# Patient Record
Sex: Male | Born: 1965 | Race: White | Hispanic: No | Marital: Single | Smoking: Current every day smoker
Health system: Southern US, Community
[De-identification: ages and names within clinical notes are randomized; demographics above are authoritative.]

## PROBLEM LIST (undated history)

## (undated) DIAGNOSIS — IMO0002 Reserved for concepts with insufficient information to code with codable children: Secondary | ICD-10-CM

## (undated) DIAGNOSIS — H5440 Blindness, one eye, unspecified eye: Secondary | ICD-10-CM

## (undated) DIAGNOSIS — IMO0001 Reserved for inherently not codable concepts without codable children: Secondary | ICD-10-CM

## (undated) DIAGNOSIS — Z6830 Body mass index (BMI) 30.0-30.9, adult: Secondary | ICD-10-CM

## (undated) DIAGNOSIS — M549 Dorsalgia, unspecified: Secondary | ICD-10-CM

## (undated) DIAGNOSIS — G8929 Other chronic pain: Secondary | ICD-10-CM

## (undated) HISTORY — DX: Body mass index (BMI) 30.0-30.9, adult: Z68.30

## (undated) HISTORY — PX: OTHER SURGICAL HISTORY: SHX169

---

## 1997-09-26 ENCOUNTER — Emergency Department (HOSPITAL_COMMUNITY): Admission: EM | Admit: 1997-09-26 | Discharge: 1997-09-26 | Payer: Self-pay | Admitting: Emergency Medicine

## 1998-02-09 ENCOUNTER — Emergency Department (HOSPITAL_COMMUNITY): Admission: EM | Admit: 1998-02-09 | Discharge: 1998-02-09 | Payer: Self-pay | Admitting: Emergency Medicine

## 1998-03-30 ENCOUNTER — Encounter: Payer: Self-pay | Admitting: Endocrinology

## 1998-03-30 ENCOUNTER — Emergency Department (HOSPITAL_COMMUNITY): Admission: EM | Admit: 1998-03-30 | Discharge: 1998-03-30 | Payer: Self-pay | Admitting: Endocrinology

## 1998-08-02 ENCOUNTER — Emergency Department (HOSPITAL_COMMUNITY): Admission: EM | Admit: 1998-08-02 | Discharge: 1998-08-02 | Payer: Self-pay | Admitting: Emergency Medicine

## 1998-09-20 ENCOUNTER — Emergency Department (HOSPITAL_COMMUNITY): Admission: EM | Admit: 1998-09-20 | Discharge: 1998-09-20 | Payer: Self-pay | Admitting: Internal Medicine

## 1998-11-20 ENCOUNTER — Emergency Department (HOSPITAL_COMMUNITY): Admission: EM | Admit: 1998-11-20 | Discharge: 1998-11-20 | Payer: Self-pay | Admitting: Emergency Medicine

## 2000-05-11 ENCOUNTER — Emergency Department (HOSPITAL_COMMUNITY): Admission: EM | Admit: 2000-05-11 | Discharge: 2000-05-11 | Payer: Self-pay

## 2000-05-18 ENCOUNTER — Emergency Department (HOSPITAL_COMMUNITY): Admission: EM | Admit: 2000-05-18 | Discharge: 2000-05-18 | Payer: Self-pay | Admitting: Emergency Medicine

## 2000-06-10 ENCOUNTER — Emergency Department (HOSPITAL_COMMUNITY): Admission: EM | Admit: 2000-06-10 | Discharge: 2000-06-10 | Payer: Self-pay | Admitting: Emergency Medicine

## 2000-12-18 ENCOUNTER — Emergency Department (HOSPITAL_COMMUNITY): Admission: EM | Admit: 2000-12-18 | Discharge: 2000-12-18 | Payer: Self-pay | Admitting: Emergency Medicine

## 2001-02-20 ENCOUNTER — Ambulatory Visit (HOSPITAL_BASED_OUTPATIENT_CLINIC_OR_DEPARTMENT_OTHER): Admission: RE | Admit: 2001-02-20 | Discharge: 2001-02-20 | Payer: Self-pay | Admitting: Oral Surgery

## 2001-02-20 ENCOUNTER — Encounter (INDEPENDENT_AMBULATORY_CARE_PROVIDER_SITE_OTHER): Payer: Self-pay | Admitting: *Deleted

## 2001-05-11 ENCOUNTER — Emergency Department (HOSPITAL_COMMUNITY): Admission: EM | Admit: 2001-05-11 | Discharge: 2001-05-12 | Payer: Self-pay | Admitting: *Deleted

## 2001-05-12 ENCOUNTER — Encounter: Payer: Self-pay | Admitting: *Deleted

## 2001-08-03 ENCOUNTER — Emergency Department (HOSPITAL_COMMUNITY): Admission: EM | Admit: 2001-08-03 | Discharge: 2001-08-04 | Payer: Self-pay | Admitting: Emergency Medicine

## 2001-11-23 ENCOUNTER — Emergency Department (HOSPITAL_COMMUNITY): Admission: EM | Admit: 2001-11-23 | Discharge: 2001-11-23 | Payer: Self-pay | Admitting: Emergency Medicine

## 2001-11-23 ENCOUNTER — Encounter: Payer: Self-pay | Admitting: Emergency Medicine

## 2002-03-04 ENCOUNTER — Encounter: Payer: Self-pay | Admitting: Emergency Medicine

## 2002-03-04 ENCOUNTER — Emergency Department (HOSPITAL_COMMUNITY): Admission: EM | Admit: 2002-03-04 | Discharge: 2002-03-05 | Payer: Self-pay | Admitting: Emergency Medicine

## 2002-03-11 ENCOUNTER — Emergency Department (HOSPITAL_COMMUNITY): Admission: EM | Admit: 2002-03-11 | Discharge: 2002-03-11 | Payer: Self-pay | Admitting: Emergency Medicine

## 2002-06-23 ENCOUNTER — Emergency Department (HOSPITAL_COMMUNITY): Admission: EM | Admit: 2002-06-23 | Discharge: 2002-06-23 | Payer: Self-pay | Admitting: Emergency Medicine

## 2002-09-01 ENCOUNTER — Encounter: Payer: Self-pay | Admitting: Emergency Medicine

## 2002-09-01 ENCOUNTER — Emergency Department (HOSPITAL_COMMUNITY): Admission: EM | Admit: 2002-09-01 | Discharge: 2002-09-01 | Payer: Self-pay | Admitting: Emergency Medicine

## 2002-09-02 ENCOUNTER — Emergency Department (HOSPITAL_COMMUNITY): Admission: EM | Admit: 2002-09-02 | Discharge: 2002-09-02 | Payer: Self-pay | Admitting: Emergency Medicine

## 2002-09-20 ENCOUNTER — Emergency Department (HOSPITAL_COMMUNITY): Admission: EM | Admit: 2002-09-20 | Discharge: 2002-09-20 | Payer: Self-pay | Admitting: Emergency Medicine

## 2002-10-25 ENCOUNTER — Encounter: Payer: Self-pay | Admitting: Radiology

## 2002-10-25 ENCOUNTER — Encounter: Admission: RE | Admit: 2002-10-25 | Discharge: 2002-10-25 | Payer: Self-pay | Admitting: Orthopedic Surgery

## 2002-10-25 ENCOUNTER — Encounter: Payer: Self-pay | Admitting: Orthopedic Surgery

## 2002-11-22 ENCOUNTER — Encounter: Admission: RE | Admit: 2002-11-22 | Discharge: 2002-11-22 | Payer: Self-pay | Admitting: Orthopedic Surgery

## 2003-06-15 ENCOUNTER — Emergency Department (HOSPITAL_COMMUNITY): Admission: EM | Admit: 2003-06-15 | Discharge: 2003-06-15 | Payer: Self-pay | Admitting: *Deleted

## 2003-09-18 ENCOUNTER — Emergency Department (HOSPITAL_COMMUNITY): Admission: EM | Admit: 2003-09-18 | Discharge: 2003-09-18 | Payer: Self-pay | Admitting: Emergency Medicine

## 2004-11-29 ENCOUNTER — Emergency Department (HOSPITAL_COMMUNITY): Admission: EM | Admit: 2004-11-29 | Discharge: 2004-11-29 | Payer: Self-pay | Admitting: Emergency Medicine

## 2005-06-23 ENCOUNTER — Ambulatory Visit: Payer: Self-pay | Admitting: Nurse Practitioner

## 2005-06-28 ENCOUNTER — Ambulatory Visit: Payer: Self-pay | Admitting: *Deleted

## 2005-06-30 ENCOUNTER — Ambulatory Visit: Payer: Self-pay | Admitting: Nurse Practitioner

## 2005-07-20 ENCOUNTER — Ambulatory Visit: Payer: Self-pay | Admitting: Nurse Practitioner

## 2005-07-28 ENCOUNTER — Ambulatory Visit (HOSPITAL_COMMUNITY): Admission: RE | Admit: 2005-07-28 | Discharge: 2005-07-28 | Payer: Self-pay | Admitting: Family Medicine

## 2005-09-12 ENCOUNTER — Ambulatory Visit: Payer: Self-pay | Admitting: Nurse Practitioner

## 2005-09-14 ENCOUNTER — Ambulatory Visit (HOSPITAL_COMMUNITY): Admission: RE | Admit: 2005-09-14 | Discharge: 2005-09-14 | Payer: Self-pay | Admitting: Family Medicine

## 2005-09-22 ENCOUNTER — Ambulatory Visit: Payer: Self-pay | Admitting: Nurse Practitioner

## 2005-10-11 ENCOUNTER — Ambulatory Visit: Payer: Self-pay | Admitting: Nurse Practitioner

## 2006-05-05 ENCOUNTER — Emergency Department (HOSPITAL_COMMUNITY): Admission: EM | Admit: 2006-05-05 | Discharge: 2006-05-05 | Payer: Self-pay | Admitting: Emergency Medicine

## 2006-09-20 ENCOUNTER — Encounter (INDEPENDENT_AMBULATORY_CARE_PROVIDER_SITE_OTHER): Payer: Self-pay | Admitting: *Deleted

## 2007-08-28 ENCOUNTER — Emergency Department (HOSPITAL_COMMUNITY): Admission: EM | Admit: 2007-08-28 | Discharge: 2007-08-28 | Payer: Self-pay | Admitting: Emergency Medicine

## 2007-11-29 ENCOUNTER — Emergency Department (HOSPITAL_COMMUNITY): Admission: EM | Admit: 2007-11-29 | Discharge: 2007-11-29 | Payer: Self-pay | Admitting: Emergency Medicine

## 2008-05-31 ENCOUNTER — Emergency Department (HOSPITAL_COMMUNITY): Admission: EM | Admit: 2008-05-31 | Discharge: 2008-05-31 | Payer: Self-pay | Admitting: Emergency Medicine

## 2008-08-11 ENCOUNTER — Emergency Department (HOSPITAL_COMMUNITY): Admission: EM | Admit: 2008-08-11 | Discharge: 2008-08-11 | Payer: Self-pay | Admitting: Emergency Medicine

## 2008-08-12 ENCOUNTER — Emergency Department (HOSPITAL_COMMUNITY): Admission: EM | Admit: 2008-08-12 | Discharge: 2008-08-12 | Payer: Self-pay | Admitting: Emergency Medicine

## 2008-09-28 ENCOUNTER — Emergency Department (HOSPITAL_COMMUNITY): Admission: EM | Admit: 2008-09-28 | Discharge: 2008-09-28 | Payer: Self-pay | Admitting: Emergency Medicine

## 2008-11-22 ENCOUNTER — Emergency Department (HOSPITAL_COMMUNITY): Admission: EM | Admit: 2008-11-22 | Discharge: 2008-11-22 | Payer: Self-pay | Admitting: Emergency Medicine

## 2009-07-30 ENCOUNTER — Emergency Department (HOSPITAL_COMMUNITY): Admission: EM | Admit: 2009-07-30 | Discharge: 2009-07-30 | Payer: Self-pay | Admitting: Emergency Medicine

## 2009-08-11 ENCOUNTER — Emergency Department (HOSPITAL_COMMUNITY): Admission: EM | Admit: 2009-08-11 | Discharge: 2009-08-12 | Payer: Self-pay | Admitting: Emergency Medicine

## 2009-09-23 ENCOUNTER — Emergency Department (HOSPITAL_COMMUNITY): Admission: EM | Admit: 2009-09-23 | Discharge: 2009-09-23 | Payer: Self-pay | Admitting: Emergency Medicine

## 2009-10-15 ENCOUNTER — Emergency Department (HOSPITAL_COMMUNITY): Admission: EM | Admit: 2009-10-15 | Discharge: 2009-10-15 | Payer: Self-pay | Admitting: Emergency Medicine

## 2009-11-12 ENCOUNTER — Emergency Department (HOSPITAL_COMMUNITY): Admission: EM | Admit: 2009-11-12 | Discharge: 2009-11-12 | Payer: Self-pay | Admitting: Emergency Medicine

## 2009-12-09 ENCOUNTER — Emergency Department (HOSPITAL_COMMUNITY)
Admission: EM | Admit: 2009-12-09 | Discharge: 2009-12-09 | Payer: Self-pay | Source: Home / Self Care | Admitting: Emergency Medicine

## 2009-12-21 ENCOUNTER — Emergency Department (HOSPITAL_COMMUNITY)
Admission: EM | Admit: 2009-12-21 | Discharge: 2009-12-21 | Payer: Self-pay | Source: Home / Self Care | Admitting: Emergency Medicine

## 2010-01-23 ENCOUNTER — Encounter: Payer: Self-pay | Admitting: Orthopedic Surgery

## 2010-02-22 ENCOUNTER — Emergency Department (HOSPITAL_COMMUNITY)
Admission: EM | Admit: 2010-02-22 | Discharge: 2010-02-22 | Disposition: A | Discharge status: Home / Self Care | Payer: Medicare Other | Attending: Emergency Medicine | Admitting: Emergency Medicine

## 2010-02-22 DIAGNOSIS — L259 Unspecified contact dermatitis, unspecified cause: Secondary | ICD-10-CM | POA: Insufficient documentation

## 2010-02-22 DIAGNOSIS — R21 Rash and other nonspecific skin eruption: Secondary | ICD-10-CM | POA: Insufficient documentation

## 2010-02-22 DIAGNOSIS — L2989 Other pruritus: Secondary | ICD-10-CM | POA: Insufficient documentation

## 2010-02-22 DIAGNOSIS — L298 Other pruritus: Secondary | ICD-10-CM | POA: Insufficient documentation

## 2010-05-21 NOTE — Op Note (Signed)
[place]. [facility]  Patient:    Timothy Boone, Timothy Boone Visit Number: [id number] MRN: [id number]          Service Type: DSU Location: [facility] Attending Physician:  Lovena Le Dictated by:   Lovena Le, D.D.S Proc. Date: 02/20/01 Admit Date:  02/20/2001                             Operative Report  PREOPERATIVE DIAGNOSES:  Left submandibular cervical mass and necrotic tooth #18.  POSTOPERATIVE DIAGNOSES:  Left submandibular cervical mass and necrotic tooth #18.  PROCEDURES: 1. Excision of left cervical submandibular mass. 2. Extraction of necrotic tooth #18.  ANESTHESIA:  General via nasotracheal intubation.  DESCRIPTION OF PROCEDURE:  The patient was brought to the operating room in satisfactory preoperative condition and placed on the operating room table in the supine position.  Following successful induction of general anesthesia via nasoendotracheal intubation, the patient was prepped and draped in the usual sterile fashion for a procedure of this type.  Initially the planned skin incision was outlined with a marking pen approximately 2 cm inferior to the inferior border of the left mandibular angle region.  Next a 2% lidocaine with 1:100,000 epinephrine was infiltrated subcutaneously in the region. Approximately 3 cc of local anesthetic was injected in total.  Next using a #15 scalpel blade, a skin incision was created in the left cervical region, the incision extended approximately 4 cm in length.  The incision was carried down through skin and subcutaneous tissue.  Bovie electrocautery was utilized to cauterize small bleeding vessels in the region.  Next the platysma was sharply divided with the Bovie.  Blunt dissection was then carried out, thereby exposing the lesion and shelling it away from its underlying tissues. 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  Once  freed from the underlying tissues, the specimen was then submitted for pathology.  The operative site was then thoroughly irrigated and inspected for adequate hemostasis.  The operative site was then closed in a layered fashion.  Initially the platysma was closed with 4-0 Vicryl sutures, the subcutaneous tissues were closed with 4-0 Vicryl as well, and the skin was then closed with a 5-0 Prolene stitch. The wound was then dressed with benzoin and Steri-Strips.  Next attention was turned intraorally, where another 1.5 cc of 2% lidocaine with 1:100,000 epinephrine was infiltrated into the posterior left mandibular soft tissues. The remaining roots of tooth #18 were then extracted surgically without complication.  This completed the procedure.  The oropharyngeal throat pack was removed, and the patient was allowed to recover from general anesthesia and was transported to the postanesthesia care unit in satisfactory postoperative condition.  All sponge, needle, and instrument counts were correct at the conclusion of the case. Dictated by:   Lovena Le, D.D.S Attending Physician:  Lovena Le DD:  02/22/01 TD:  02/22/01 Job: [id number]

## 2010-06-28 ENCOUNTER — Emergency Department (HOSPITAL_COMMUNITY)
Admission: EM | Admit: 2010-06-28 | Discharge: 2010-06-28 | Disposition: A | Discharge status: Home / Self Care | Payer: Medicare Other | Attending: Emergency Medicine | Admitting: Emergency Medicine

## 2010-06-28 DIAGNOSIS — M542 Cervicalgia: Secondary | ICD-10-CM | POA: Insufficient documentation

## 2010-06-28 DIAGNOSIS — K089 Disorder of teeth and supporting structures, unspecified: Secondary | ICD-10-CM | POA: Insufficient documentation

## 2010-06-28 DIAGNOSIS — G8929 Other chronic pain: Secondary | ICD-10-CM | POA: Insufficient documentation

## 2010-06-28 DIAGNOSIS — Z79899 Other long term (current) drug therapy: Secondary | ICD-10-CM | POA: Insufficient documentation

## 2010-06-28 DIAGNOSIS — K029 Dental caries, unspecified: Secondary | ICD-10-CM | POA: Insufficient documentation

## 2010-10-08 ENCOUNTER — Emergency Department (HOSPITAL_COMMUNITY)
Admission: EM | Admit: 2010-10-08 | Discharge: 2010-10-09 | Disposition: A | Discharge status: Home / Self Care | Payer: Medicare Other | Attending: Emergency Medicine | Admitting: Emergency Medicine

## 2010-10-08 DIAGNOSIS — G8929 Other chronic pain: Secondary | ICD-10-CM | POA: Insufficient documentation

## 2010-10-08 DIAGNOSIS — M542 Cervicalgia: Secondary | ICD-10-CM | POA: Insufficient documentation

## 2010-10-08 DIAGNOSIS — K089 Disorder of teeth and supporting structures, unspecified: Secondary | ICD-10-CM | POA: Insufficient documentation

## 2010-10-08 DIAGNOSIS — IMO0002 Reserved for concepts with insufficient information to code with codable children: Secondary | ICD-10-CM | POA: Insufficient documentation

## 2011-02-02 ENCOUNTER — Emergency Department (HOSPITAL_COMMUNITY)
Admission: EM | Admit: 2011-02-02 | Discharge: 2011-02-02 | Disposition: A | Discharge status: Home / Self Care | Payer: Medicare Other | Attending: Emergency Medicine | Admitting: Emergency Medicine

## 2011-02-02 ENCOUNTER — Encounter (HOSPITAL_COMMUNITY): Payer: Self-pay | Admitting: *Deleted

## 2011-02-02 DIAGNOSIS — K0889 Other specified disorders of teeth and supporting structures: Secondary | ICD-10-CM

## 2011-02-02 DIAGNOSIS — K089 Disorder of teeth and supporting structures, unspecified: Secondary | ICD-10-CM | POA: Insufficient documentation

## 2011-02-02 MED ORDER — CLINDAMYCIN HCL 150 MG PO CAPS: 300.0000 mg | ORAL_CAPSULE | Freq: Three times a day (TID) | ORAL | Status: AC

## 2011-02-02 MED ORDER — OXYCODONE-ACETAMINOPHEN 5-325 MG PO TABS: 2.0000 | ORAL_TABLET | ORAL | Status: AC | PRN

## 2011-02-02 NOTE — ED Notes (Signed)
Reports having left lower dental abscess. Airway intact.

## 2011-02-02 NOTE — ED Provider Notes (Signed)
History     CSN: [id number]  Arrival date & time 02/02/11  1840   First MD Initiated Contact with Patient 02/02/11 1920      Chief Complaint  Patient presents with  . Dental Pain    (Consider location/radiation/quality/duration/timing/severity/associated sxs/prior treatment) HPI Comments: Left lower molar pain for the past week. Poor dentition history. No fever, vomiting, difficulty breathing or swallowing.  Patient is a 46 y.o. male presenting with tooth pain. The history is provided by the patient.  Dental PainThe primary symptoms include mouth pain and dental injury. Primary symptoms do not include headaches, fever, shortness of breath or cough. The symptoms began 5 to 7 days ago. The symptoms are unchanged. The symptoms are recurrent. The symptoms occur constantly.  Additional symptoms do not include: trouble swallowing and drooling.    History reviewed. No pertinent past medical history.  History reviewed. No pertinent past surgical history.  History reviewed. No pertinent family history.  History  Substance Use Topics  . Smoking status: Current Everyday Smoker -- 1.0 packs/day    Types: Cigarettes  . Smokeless tobacco: Not on file  . Alcohol Use: No      Review of Systems  Constitutional: Negative for fever.  HENT: Positive for dental problem. Negative for drooling, trouble swallowing and voice change.   Respiratory: Negative for cough, chest tightness and shortness of breath.   Gastrointestinal: Negative for nausea, vomiting and abdominal pain.  Genitourinary: Negative for dysuria and hematuria.  Musculoskeletal: Negative for back pain.  Neurological: Negative for headaches.    Allergies  Penicillins  Home Medications   Current Outpatient Rx  Name Route Sig Dispense Refill  . METAXALONE 800 MG PO TABS Oral Take 800 mg by mouth 4 (four) times daily as needed. For muscle spasms      BP 137/85  Pulse 65  Temp(Src) 97.8 F (36.6 C) (Oral)  Resp 18   SpO2 96%  Physical Exam  Constitutional: He is oriented to person, place, and time. He appears well-developed and well-nourished. No distress.  HENT:  Head: Normocephalic and atraumatic.  Mouth/Throat: Oropharynx is clear and moist. No oropharyngeal exudate.       Multiple missing teeth, broken off left lower premolar No abscess, floor of mouth soft, no trismus  Eyes: Conjunctivae and EOM are normal. Pupils are equal, round, and reactive to light.  Neck: Normal range of motion. Neck supple.       No meningismus  Pulmonary/Chest: No respiratory distress.  Musculoskeletal: Normal range of motion. He exhibits no tenderness.  Neurological: He is alert and oriented to person, place, and time. No cranial nerve deficit.  Skin: Skin is warm.    ED Course  Procedures (including critical care time)  Labs Reviewed - No data to display No results found.   No diagnosis found.    MDM  Dental pain, no evidence of abscess or ludwig's angina.  Antibiotics, pain medications, dental referral      Glynn Octave, MD 02/02/11 1929

## 2011-02-02 NOTE — ED Notes (Signed)
Pt c/o L lower abscess x1 wk with intermittent pain, increase pain today, unable to control the pain w/OTC Advil. Pt reports applying Vodka to a wash cloth and soaking the area w/no relief.

## 2011-02-03 ENCOUNTER — Emergency Department (HOSPITAL_COMMUNITY)
Admission: EM | Admit: 2011-02-03 | Discharge: 2011-02-03 | Disposition: A | Discharge status: Home / Self Care | Payer: Medicare Other | Attending: Emergency Medicine | Admitting: Emergency Medicine

## 2011-02-03 ENCOUNTER — Encounter (HOSPITAL_COMMUNITY): Payer: Self-pay | Admitting: Emergency Medicine

## 2011-02-03 DIAGNOSIS — S025XXA Fracture of tooth (traumatic), initial encounter for closed fracture: Secondary | ICD-10-CM | POA: Insufficient documentation

## 2011-02-03 DIAGNOSIS — X58XXXA Exposure to other specified factors, initial encounter: Secondary | ICD-10-CM | POA: Insufficient documentation

## 2011-02-03 DIAGNOSIS — K089 Disorder of teeth and supporting structures, unspecified: Secondary | ICD-10-CM | POA: Insufficient documentation

## 2011-02-03 DIAGNOSIS — R6884 Jaw pain: Secondary | ICD-10-CM | POA: Insufficient documentation

## 2011-02-03 DIAGNOSIS — K0889 Other specified disorders of teeth and supporting structures: Secondary | ICD-10-CM

## 2011-02-03 MED ORDER — OXYCODONE-ACETAMINOPHEN 10-325 MG PO TABS: 1.0000 | ORAL_TABLET | ORAL | Status: DC | PRN

## 2011-02-03 MED ORDER — HYDROMORPHONE HCL PF 1 MG/ML IJ SOLN
1.0000 mg | Freq: Once | INTRAMUSCULAR | Status: AC
  Administered 2011-02-03: 1 mg via INTRAMUSCULAR
  Filled 2011-02-03: qty 1

## 2011-02-03 MED ORDER — KETOROLAC TROMETHAMINE 30 MG/ML IJ SOLN
30.0000 mg | Freq: Once | INTRAMUSCULAR | Status: AC
  Administered 2011-02-03: 30 mg via INTRAMUSCULAR
  Filled 2011-02-03: qty 1

## 2011-02-03 NOTE — ED Notes (Signed)
PT. REPORTS PERSISTENT LEFT LOWER MOLAR PAIN FOR SEVERAL DAYS , SEEN HERE YESTERDAY -PRESCRIPTIONS GIVEN WITH NO RELIEF.

## 2011-02-03 NOTE — ED Notes (Signed)
Care assumed. Pt here with c/o L  Lower dental pain. tx here last pm. States the antibiotics are not strong enough and the pain med is not working for him. Pt states a high tolerance to pain meds. Noted- L lower jaw  swelling.

## 2011-02-03 NOTE — ED Provider Notes (Signed)
History     CSN: [id number]  Arrival date & time 02/03/11  1924   First MD Initiated Contact with Patient 02/03/11 2055      Chief Complaint  Patient presents with  . Dental Pain    (Consider location/radiation/quality/duration/timing/severity/associated sxs/prior treatment) Patient is a 46 y.o. male presenting with tooth pain. The history is provided by the patient.  Dental PainPrimary symptoms do not include fever, sore throat or cough.   patient's had pain in his left lower jaw for last several days. Is seen in ER yesterday for the same. He was given clindamycin Doxy codon. He states he continues to have severe pain has been unable to sleep. No fevers. He states he only goes to the free clinic at the [facility] present. He'll not be there until November again.  History reviewed. No pertinent past medical history.  History reviewed. No pertinent past surgical history.  No family history on file.  History  Substance Use Topics  . Smoking status: Current Everyday Smoker -- 1.0 packs/day    Types: Cigarettes  . Smokeless tobacco: Not on file  . Alcohol Use: No      Review of Systems  Constitutional: Negative for fever and chills.  HENT: Positive for dental problem. Negative for sore throat and mouth sores.   Eyes: Negative for pain.  Respiratory: Negative for cough.     Allergies  Chicken allergy and Penicillins  Home Medications   Current Outpatient Rx  Name Route Sig Dispense Refill  . CLINDAMYCIN HCL 150 MG PO CAPS Oral Take 2 capsules (300 mg total) by mouth 3 (three) times daily. 42 capsule 0  . METHOCARBAMOL 500 MG PO TABS Oral Take 500-2,000 mg by mouth 4 (four) times daily. As needed for muscle spasms.    . OXYCODONE-ACETAMINOPHEN 5-325 MG PO TABS Oral Take 2 tablets by mouth every 4 (four) hours as needed for pain. 15 tablet 0  . OXYCODONE-ACETAMINOPHEN 10-325 MG PO TABS Oral Take 1 tablet by mouth every 4 (four) hours as needed for pain. 15 tablet 0    BP  151/91  Pulse 58  Temp(Src) 97.4 F (36.3 C) (Oral)  Resp 20  SpO2 99%  Physical Exam  Constitutional: He appears well-developed.  HENT:  Head: Normocephalic.       Poor dentition. Multiple worn down teeth. Left lower fifth from midline to a broken off at the gum. Tender. No fluctuance. No swelling of floor of mouth or laterally.  Eyes: Pupils are equal, round, and reactive to light.  Cardiovascular: Normal rate.     ED Course  Procedures (including critical care time)  Labs Reviewed - No data to display No results found.   1. Pain, dental       MDM  Dental pain a poor dentition. Seen yesterday for same. We'll increase pain medication. Patient does not want dental block. He'll be given the name of a dentist to follow with.        Juliet Rude. Rubin Payor, MD 02/03/11 2136

## 2011-02-05 ENCOUNTER — Encounter (HOSPITAL_COMMUNITY): Payer: Self-pay | Admitting: *Deleted

## 2011-02-05 ENCOUNTER — Emergency Department (HOSPITAL_COMMUNITY)
Admission: EM | Admit: 2011-02-05 | Discharge: 2011-02-05 | Disposition: A | Discharge status: Home / Self Care | Payer: Medicare Other | Attending: Emergency Medicine | Admitting: Emergency Medicine

## 2011-02-05 DIAGNOSIS — K089 Disorder of teeth and supporting structures, unspecified: Secondary | ICD-10-CM | POA: Insufficient documentation

## 2011-02-05 DIAGNOSIS — K047 Periapical abscess without sinus: Secondary | ICD-10-CM

## 2011-02-05 DIAGNOSIS — F17200 Nicotine dependence, unspecified, uncomplicated: Secondary | ICD-10-CM | POA: Insufficient documentation

## 2011-02-05 MED ORDER — OXYCODONE-ACETAMINOPHEN 5-325 MG PO TABS
2.0000 | ORAL_TABLET | Freq: Once | ORAL | Status: AC
  Administered 2011-02-05: 2 via ORAL
  Filled 2011-02-05: qty 2

## 2011-02-05 NOTE — ED Notes (Signed)
Reports toothache to left lower side with swelling, airway intact.

## 2011-02-05 NOTE — ED Provider Notes (Signed)
History     CSN: [id number]  Arrival date & time 02/05/11  1717   First MD Initiated Contact with Patient 02/05/11 1728      Chief Complaint  Patient presents with  . Dental Pain     Patient is a 46 y.o. male presenting with tooth pain. The history is provided by the patient.  Dental PainThe primary symptoms include mouth pain. Primary symptoms do not include fever. The symptoms began 3 to 5 days ago. The symptoms are worsening. The symptoms are recurrent. The symptoms occur constantly.  Additional symptoms do not include: drooling.  pt reports dental abscess, already on antibiotics, now swelling in mouth worse No fever/vomiting Reports he has dental f/u this week He is taking his meds as scheduled No pain meds in past several hrs prior to evaluation  PMH - none  History reviewed. No pertinent past surgical history.  History reviewed. No pertinent family history.  History  Substance Use Topics  . Smoking status: Current Everyday Smoker -- 1.0 packs/day    Types: Cigarettes  . Smokeless tobacco: Not on file  . Alcohol Use: No      Review of Systems  Constitutional: Negative for fever.  HENT: Negative for drooling.     Allergies  Chicken allergy and Penicillins  Home Medications   Current Outpatient Rx  Name Route Sig Dispense Refill  . CLINDAMYCIN HCL 150 MG PO CAPS Oral Take 2 capsules (300 mg total) by mouth 3 (three) times daily. 42 capsule 0  . HYDROCODONE-ACETAMINOPHEN 10-500 MG PO TABS Oral Take 2 tablets by mouth every 6 (six) hours as needed. For pain    . METHOCARBAMOL 500 MG PO TABS Oral Take 500-2,000 mg by mouth 4 (four) times daily. As needed for muscle spasms.    . OXYCODONE-ACETAMINOPHEN 10-325 MG PO TABS Oral Take 1 tablet by mouth every 4 (four) hours as needed.    . OXYCODONE-ACETAMINOPHEN 5-325 MG PO TABS Oral Take 2 tablets by mouth every 4 (four) hours as needed for pain. 15 tablet 0    BP 144/92  Pulse 103  Temp(Src) 98.1 F (36.7 C)  (Oral)  Resp 20  SpO2 97%  Physical Exam CONSTITUTIONAL: Well developed/well nourished HEAD AND FACE: Normocephalic/atraumatic EYES: EOMI/PERRL ENMT: Mucous membranes moist, poor dentition, no trismus, floor of mouth soft Dental abscess noted at left lower premolar with tenderness noted No external facial abscess noted NECK: supple no meningeal signs CV: S1/S2 noted, no murmurs/rubs/gallops noted LUNGS: Lungs are clear to auscultation bilaterally, no apparent distress ABDOMEN: soft, nontender, no rebound or guarding NEURO: Pt is awake/alert, moves all extremitiesx4 EXTREMITIES: pulses normal, full ROM SKIN: warm, color normal PSYCH: no abnormalities of mood noted  ED Course  NERVE BLOCK Performed by: Joya Gaskins Authorized by: Joya Gaskins Consent: Verbal consent obtained. Consent given by: patient Patient understanding: patient states understanding of the procedure being performed Patient identity confirmed: verbally with patient Indications: pain relief Body area: face/mouth Nerve: inferior alveolar Laterality: left Patient sedated: no Needle gauge: 25 G Location technique: anatomical landmarks Local anesthetic: bupivacaine 0.5% with epinephrine Anesthetic total: 3 ml Outcome: pain improved Patient tolerance: Patient tolerated the procedure well with no immediate complications.  Dental Date/Time: 02/05/2011 6:44 PM Performed by: Joya Gaskins Authorized by: Joya Gaskins Consent: Verbal consent obtained. Patient identity confirmed: verbally with patient Time out: Immediately prior to procedure a "time out" was called to verify the correct patient, procedure, equipment, support staff and site/side marked as required.  Patient sedated: no Patient tolerance: Patient tolerated the procedure well with no immediate complications. Comments: Needle drainage of tooth abscess with small amt of pus extracted Bleeding controlled, airway patent      1.  Dental abscess       MDM  Nursing notes reviewed and considered in documentation Previous records reviewed and considered         Joya Gaskins, MD 02/05/11 [phone]

## 2011-03-28 ENCOUNTER — Encounter (HOSPITAL_COMMUNITY): Payer: Self-pay | Admitting: Emergency Medicine

## 2011-03-28 ENCOUNTER — Emergency Department (HOSPITAL_COMMUNITY)
Admission: EM | Admit: 2011-03-28 | Discharge: 2011-03-28 | Disposition: A | Discharge status: Home / Self Care | Payer: Medicare Other | Attending: Emergency Medicine | Admitting: Emergency Medicine

## 2011-03-28 DIAGNOSIS — F17200 Nicotine dependence, unspecified, uncomplicated: Secondary | ICD-10-CM | POA: Insufficient documentation

## 2011-03-28 DIAGNOSIS — R51 Headache: Secondary | ICD-10-CM | POA: Insufficient documentation

## 2011-03-28 DIAGNOSIS — Z79899 Other long term (current) drug therapy: Secondary | ICD-10-CM | POA: Insufficient documentation

## 2011-03-28 DIAGNOSIS — J3489 Other specified disorders of nose and nasal sinuses: Secondary | ICD-10-CM | POA: Insufficient documentation

## 2011-03-28 DIAGNOSIS — R0981 Nasal congestion: Secondary | ICD-10-CM

## 2011-03-28 DIAGNOSIS — IMO0001 Reserved for inherently not codable concepts without codable children: Secondary | ICD-10-CM | POA: Insufficient documentation

## 2011-03-28 HISTORY — DX: Reserved for inherently not codable concepts without codable children: IMO0001

## 2011-03-28 MED ORDER — KETOROLAC TROMETHAMINE 30 MG/ML IJ SOLN
30.0000 mg | Freq: Once | INTRAMUSCULAR | Status: AC
  Administered 2011-03-28: 30 mg via INTRAVENOUS
  Filled 2011-03-28: qty 1

## 2011-03-28 MED ORDER — METOCLOPRAMIDE HCL 5 MG/ML IJ SOLN
10.0000 mg | Freq: Once | INTRAMUSCULAR | Status: AC
  Administered 2011-03-28: 10 mg via INTRAVENOUS
  Filled 2011-03-28: qty 2

## 2011-03-28 MED ORDER — DIPHENHYDRAMINE HCL 50 MG/ML IJ SOLN
25.0000 mg | Freq: Once | INTRAMUSCULAR | Status: AC
  Administered 2011-03-28: 25 mg via INTRAVENOUS
  Filled 2011-03-28: qty 1

## 2011-03-28 MED ORDER — GUAIFENESIN ER 1200 MG PO TB12: 1.0000 | ORAL_TABLET | Freq: Two times a day (BID) | ORAL | Status: DC

## 2011-03-28 MED ORDER — PREDNISONE 50 MG PO TABS: 50.0000 mg | ORAL_TABLET | Freq: Every day | ORAL | Status: AC

## 2011-03-28 MED ORDER — SODIUM CHLORIDE 0.9 % IV BOLUS (SEPSIS)
1000.0000 mL | Freq: Once | INTRAVENOUS | Status: AC
  Administered 2011-03-28: 1000 mL via INTRAVENOUS

## 2011-03-28 NOTE — ED Notes (Signed)
Pt co headache for 1 week.  Excedrin has been controlling but is not helping any longer.  Pt has history of migraines, with last migraine being last year and he was seen in ED for it. Pt denies N/V but is light sensitive.  Pt alert and oriented X4.

## 2011-03-28 NOTE — ED Provider Notes (Signed)
History     CSN: [id number]  Arrival date & time 03/28/11  6712   First MD Initiated Contact with Patient 03/28/11 [phone]      Chief Complaint  Patient presents with  . Headache    (Consider location/radiation/quality/duration/timing/severity/associated sxs/prior treatment) HPI Patient resents emergency department with complaint of headache that has been ongoing since this morning.  Patient states he has a history of migraines.  He feels like there is a nasal congestion or last 4 days.  States the pain is around his nasal passages, eyes and forehead.  Patient states he did not try anything to relieve the symptoms.  The patient states that he does not have blurred vision numbness or weakness in his extremities, chest pain, shortness of breath, abdominal pain, or nausea/vomiting.  He states that he is light sensitive at this time.  Patient states that he does have some mild noise sensitivity.        Past Medical History  Diagnosis Date  . No significant past medical history     History reviewed. No pertinent past surgical history.  History reviewed. No pertinent family history.  History  Substance Use Topics  . Smoking status: Current Everyday Smoker -- 0.5 packs/day    Types: Cigarettes  . Smokeless tobacco: Not on file  . Alcohol Use: No      Review of Systems All pertinent positives and negatives reviewed in the history of present illness  Allergies  Chicken allergy and Penicillins  Home Medications   Current Outpatient Rx  Name Route Sig Dispense Refill  . EXCEDRIN PO Oral Take 2 tablets by mouth every 6 (six) hours as needed. For pain.    Marland Kitchen METHOCARBAMOL 500 MG PO TABS Oral Take 500-2,000 mg by mouth 4 (four) times daily. As needed for muscle spasms.    Marland Kitchen OVER THE COUNTER MEDICATION Oral Take 1 tablet by mouth 2 (two) times daily. Store brand mucinex      BP 132/82  Pulse 78  Temp(Src) 98 F (36.7 C) (Oral)  Resp 18  SpO2 98%  Physical Exam    Constitutional: He is oriented to person, place, and time. He appears well-developed and well-nourished. No distress.  HENT:  Head: Normocephalic and atraumatic.  Eyes: Pupils are equal, round, and reactive to light.  Cardiovascular: Normal rate, regular rhythm and normal heart sounds.  Exam reveals no gallop and no friction rub.   No murmur heard. Pulmonary/Chest: Effort normal and breath sounds normal.  Neurological: He is alert and oriented to person, place, and time. He has normal strength. No sensory deficit. Coordination and gait normal.    ED Course  Procedures (including critical care time)   The patient is sleeping at this time. He will be treated for his nasal congestion as a possible cause of this headache. The patient is advised to return here as needed. Increase his fluid intake.        MDM  MDM Reviewed: previous chart, nursing note and vitals           Carlyle Dolly, PA-C 03/28/11 [phone]

## 2011-03-28 NOTE — Discharge Instructions (Signed)
Return here as needed.  Increase your fluid intake. 

## 2011-03-28 NOTE — ED Notes (Signed)
Patient complaining of headache that started around 0300 tonight; patient states that he has had a "slight" headache over the past week, but the pain increasingly gotten worse tonight.  Patient has been taken Excedrin for his headaches throughout the week, which has seemed to control his pain.  Denies blurred vision; reports light sensitivity.  Patient states that when he lays down, pain becomes worse.  Patient took Excedrin two hours ago.

## 2011-03-31 NOTE — ED Provider Notes (Signed)
Medical screening examination/treatment/procedure(s) were conducted as a shared visit with non-physician practitioner(s) and myself.  I personally evaluated the patient during the encounter Pt c/o dull frontal headache. Same as prior headaches. Gradual onset. No neck stiffness or r igidity.  Suzi Roots, MD 03/31/11 [phone]

## 2011-05-02 ENCOUNTER — Encounter (HOSPITAL_COMMUNITY): Payer: Self-pay | Admitting: Emergency Medicine

## 2011-05-02 ENCOUNTER — Emergency Department (HOSPITAL_COMMUNITY)
Admission: EM | Admit: 2011-05-02 | Discharge: 2011-05-02 | Disposition: A | Discharge status: Home / Self Care | Payer: Medicare Other | Attending: Emergency Medicine | Admitting: Emergency Medicine

## 2011-05-02 DIAGNOSIS — H938X9 Other specified disorders of ear, unspecified ear: Secondary | ICD-10-CM | POA: Insufficient documentation

## 2011-05-02 DIAGNOSIS — F17200 Nicotine dependence, unspecified, uncomplicated: Secondary | ICD-10-CM | POA: Insufficient documentation

## 2011-05-02 DIAGNOSIS — H6592 Unspecified nonsuppurative otitis media, left ear: Secondary | ICD-10-CM

## 2011-05-02 DIAGNOSIS — Z79899 Other long term (current) drug therapy: Secondary | ICD-10-CM | POA: Insufficient documentation

## 2011-05-02 DIAGNOSIS — H9190 Unspecified hearing loss, unspecified ear: Secondary | ICD-10-CM | POA: Insufficient documentation

## 2011-05-02 HISTORY — DX: Blindness, one eye, unspecified eye: H54.40

## 2011-05-02 HISTORY — DX: Reserved for concepts with insufficient information to code with codable children: IMO0002

## 2011-05-02 NOTE — ED Notes (Signed)
Patient given discharge paperwork; went over discharge instructions with patient. Patient instructed to follow up with ENT physician if symptoms persist more than a few weeks, to use resource guide to find a primary care physician, and to return to the ED for new, worsening, or concerning symptoms.

## 2011-05-02 NOTE — ED Provider Notes (Signed)
History     CSN: [id number]  Arrival date & time 05/02/11  2030   First MD Initiated Contact with Patient 05/02/11 2143      Chief Complaint  Patient presents with  . Hearing Problem    Location-L ear/No radiation/quality-no pain/duration-2 weeks/timing-constant/severity-mild/No associated sxs/No prior treatment) Patient is a 46 y.o. male presenting with plugged ear sensation. The history is provided by the patient. No language interpreter was used.  Ear Fullness This is a new problem. The current episode started 1 to 4 weeks ago. The problem occurs constantly. The problem has been unchanged. Pertinent negatives include no abdominal pain, chest pain, chills, congestion, coughing, fever, headaches, nausea, neck pain, numbness, rash, sore throat, vomiting or weakness. The symptoms are aggravated by nothing. He has tried nothing for the symptoms.    Past Medical History  Diagnosis Date  . No significant past medical history   . Disc degeneration   . Disc degeneration   . Blind right eye     History reviewed. No pertinent past surgical history.  History reviewed. No pertinent family history.  History  Substance Use Topics  . Smoking status: Current Everyday Smoker -- 0.5 packs/day    Types: Cigarettes  . Smokeless tobacco: Not on file  . Alcohol Use: No      Review of Systems  Constitutional: Negative for fever and chills.  HENT: Positive for hearing loss. Negative for ear pain, nosebleeds, congestion, sore throat, rhinorrhea, sneezing, trouble swallowing, neck pain, neck stiffness, voice change, sinus pressure, tinnitus and ear discharge.   Eyes: Negative for visual disturbance.  Respiratory: Negative for cough, chest tightness and shortness of breath.   Cardiovascular: Negative for chest pain, palpitations and leg swelling.  Gastrointestinal: Negative for nausea, vomiting, abdominal pain, diarrhea, constipation, blood in stool and abdominal distention.  Genitourinary:  Negative for dysuria, urgency, hematuria and difficulty urinating.  Musculoskeletal: Negative for back pain and gait problem.  Skin: Negative for rash.  Neurological: Negative for dizziness, tremors, seizures, syncope, facial asymmetry, speech difficulty, weakness, light-headedness, numbness and headaches.  Hematological: Negative for adenopathy. Does not bruise/bleed easily.  Psychiatric/Behavioral: Negative for confusion.    Allergies  Chicken allergy and Penicillins  Home Medications   Current Outpatient Rx  Name Route Sig Dispense Refill  . METHOCARBAMOL 500 MG PO TABS Oral Take 1,500 mg by mouth 4 (four) times daily as needed. As needed for muscle spasms.      BP 137/98  Pulse 69  Temp(Src) 97.8 F (36.6 C) (Oral)  Resp 18  SpO2 97%  Physical Exam  Constitutional: He is oriented to person, place, and time. He appears well-developed and well-nourished. No distress.  HENT:  Head: Normocephalic and atraumatic.  Right Ear: Hearing, tympanic membrane, external ear and ear canal normal.  Left Ear: External ear and ear canal normal. No lacerations. No drainage, swelling or tenderness. No foreign bodies. No mastoid tenderness. Tympanic membrane is not erythematous, not retracted and not bulging. A middle ear effusion is present. No hemotympanum. Decreased hearing is noted.  Nose: Nose normal. Right sinus exhibits no maxillary sinus tenderness and no frontal sinus tenderness. Left sinus exhibits no maxillary sinus tenderness and no frontal sinus tenderness.  Mouth/Throat: Uvula is midline, oropharynx is clear and moist and mucous membranes are normal. No oropharyngeal exudate.  Eyes: Conjunctivae are normal. Right eye exhibits no discharge. Left eye exhibits no discharge. No scleral icterus.  Neck: Normal range of motion. Neck supple.  Cardiovascular: Normal rate, regular rhythm, normal heart sounds  and intact distal pulses.   No murmur heard. Pulmonary/Chest: Effort normal and  breath sounds normal. No respiratory distress.  Abdominal: Bowel sounds are normal.  Musculoskeletal: Normal range of motion. He exhibits no edema and no tenderness.  Neurological: He is alert and oriented to person, place, and time.  Skin: Skin is warm and dry. He is not diaphoretic.  Psychiatric: He has a normal mood and affect.    ED Course  Procedures (including critical care time)  Labs Reviewed - No data to display No results found.   1. Middle ear effusion, left     MDM  Pt is a well appearing 46yo M who presents with 2 weeks of L ear muffled hearing. VSS. AF. NAD. Exam consistent with middle ear effusion. Antihistamines and decongestants recommended and ENT f/u if sx not improving.         Consuello Masse, MD 05/03/11 [phone]

## 2011-05-02 NOTE — Discharge Instructions (Signed)
Serous Otitis Media  Serous otitis media is also known as otitis media with effusion (OME). It means there is fluid in the middle ear space. This space contains the bones for hearing and air. Air in the middle ear space helps to transmit sound.  The air gets there through the eustachian tube. This tube goes from the back of the throat to the middle ear space. It keeps the pressure in the middle ear the same as the outside world. It also helps to drain fluid from the middle ear space. CAUSES  OME occurs when the eustachian tube gets blocked. Blockage can come from:  Ear infections.   Colds and other upper respiratory infections.   Allergies.   Irritants such as cigarette smoke.   Sudden changes in air pressure (such as descending in an airplane).   Enlarged adenoids.  During colds and upper respiratory infections, the middle ear space can become temporarily filled with fluid. This can happen after an ear infection also. Once the infection clears, the fluid will generally drain out of the ear through the eustachian tube. If it does not, then OME occurs. SYMPTOMS   Hearing loss.   A feeling of fullness in the ear - but no pain.   Young children may not show any symptoms.  DIAGNOSIS   Diagnosis of OME is made by an ear exam.   Tests may be done to check on the movement of the eardrum.   Hearing exams may be done.  TREATMENT   The fluid most often goes away without treatment.   If allergy is the cause, allergy treatment may be helpful. Try over the counter benadryl as instructed on package. You may also try over the counter decongestants like pseudoephedrine as instructed on package.   Fluid that persists for several months may require minor surgery. A small tube is placed in the ear drum to:   Drain the fluid.   Restore the air in the middle ear space.   In certain situations, antibiotics are used to avoid surgery.   Surgery may be done to remove enlarged adenoids (if this  is the cause).  HOME CARE INSTRUCTIONS   Keep children away from tobacco smoke.   Be sure to keep follow up appointments, if any.  SEEK MEDICAL CARE IF:   Hearing is not better in 3 months.   Hearing is worse.   Ear pain.   Drainage from the ear.   Dizziness.  Document Released: 03/12/2003 Document Revised: 12/09/2010 Document Reviewed: 01/10/2008 [facility] Patient Information 2012 [place], [place].  RESOURCE GUIDE  Dental Problems  Patients with Medicaid: [facility] [phone] [address]                                           [phone] [address] Phone:  [phone]                                                  Phone:  [phone]  If unable to pay or uninsured, contact:  [facility] or [facility]. to become qualified for the adult dental  clinic.  Chronic Pain Problems Contact [facility]  [phone] Patients need to be referred by their primary care doctor.  Insufficient Money for Medicine Contact [employer]:  call "211" or [facility] 713-254-3996.  No Primary Care Doctor Call [facility]  [phone] Other agencies that provide inexpensive medical care    [facility]  [phone]    [facility]  [phone]    [facility]  [phone]    [facility]  [phone]    [facility]  [phone]    [facility]  [phone]  Psychological Services [facility]  [phone] [facility]  [phone] [facility]   [phone] (emergency services 334-220-5905)  Substance Abuse Resources [facility]  [phone] [facility] [phone] [facility] [place] [phone] Floydene Flock [phone] Residential & Outpatient Substance Abuse Program  424-569-8495  Abuse/Neglect [facility] Child Abuse Hotline ([phone] [facility] Child Abuse Hotline 276-090-9408 (After  Hours)  Emergency Shelter [facility] 618-640-2398  M[facility] [place] [facility] [phone])[phone] [facility] ([phone]  MRSA Hotline #:   680 601 3863    [facility] Resources  [facility]     [employer]                          [facility] [address] [place]                       [address]      [address]  Blondell Reveal Phone:  [phone]                                   Phone:  [phone]                 Phone:  [phone]  [facility] Mental Health Phone:  [phone]  [facility] Child Abuse Hotline [phone] (After Hours)

## 2011-05-02 NOTE — ED Notes (Addendum)
Pt comes to the ED reporting difficulty hearing in the left ear.  Pt states sounds are "muffled" and is worse in the morning.  Pt denies any injury to the ear.    Pt attempted to irrigate ear two days ago with no relief.  He also says that he hears a "popping" sound in his left ear when he yawns or swallows.

## 2011-05-02 NOTE — ED Notes (Signed)
PT reports for 2 weeks his left ear has been stopped up; no pain; not feeling dizzy; has not tried to stick anything in there. Reports it is worse in morning; no nasal drainage.

## 2011-05-03 NOTE — ED Provider Notes (Signed)
I reviewed the resident chart and discussed patient care with the resident physician and was available for consultation and any procedure supervision during the entire patient encounter.    Timothy Boone. Bond Grieshop, MD 05/03/11 1610

## 2011-09-12 ENCOUNTER — Emergency Department (HOSPITAL_COMMUNITY)
Admission: EM | Admit: 2011-09-12 | Discharge: 2011-09-12 | Disposition: A | Discharge status: Home / Self Care | Payer: Medicare Other | Attending: Emergency Medicine | Admitting: Emergency Medicine

## 2011-09-12 ENCOUNTER — Encounter (HOSPITAL_COMMUNITY): Payer: Self-pay

## 2011-09-12 DIAGNOSIS — N492 Inflammatory disorders of scrotum: Secondary | ICD-10-CM

## 2011-09-12 DIAGNOSIS — H5440 Blindness, one eye, unspecified eye: Secondary | ICD-10-CM | POA: Insufficient documentation

## 2011-09-12 DIAGNOSIS — F17200 Nicotine dependence, unspecified, uncomplicated: Secondary | ICD-10-CM | POA: Insufficient documentation

## 2011-09-12 DIAGNOSIS — B356 Tinea cruris: Secondary | ICD-10-CM | POA: Insufficient documentation

## 2011-09-12 DIAGNOSIS — N498 Inflammatory disorders of other specified male genital organs: Secondary | ICD-10-CM | POA: Insufficient documentation

## 2011-09-12 DIAGNOSIS — IMO0002 Reserved for concepts with insufficient information to code with codable children: Secondary | ICD-10-CM | POA: Insufficient documentation

## 2011-09-12 MED ORDER — CLOTRIMAZOLE 1 % EX CREA: TOPICAL_CREAM | CUTANEOUS | Status: DC

## 2011-09-12 MED ORDER — SULFAMETHOXAZOLE-TRIMETHOPRIM 800-160 MG PO TABS: 1.0000 | ORAL_TABLET | Freq: Two times a day (BID) | ORAL | Status: DC

## 2011-09-12 MED ORDER — HYDROCODONE-ACETAMINOPHEN 5-325 MG PO TABS: 2.0000 | ORAL_TABLET | ORAL | Status: DC | PRN

## 2011-09-12 MED ORDER — CLINDAMYCIN HCL 150 MG PO CAPS: 300.0000 mg | ORAL_CAPSULE | Freq: Four times a day (QID) | ORAL | Status: AC

## 2011-09-12 MED ORDER — HYDROCODONE-ACETAMINOPHEN 5-325 MG PO TABS: 2.0000 | ORAL_TABLET | ORAL | Status: AC | PRN

## 2011-09-12 MED ORDER — CEPHALEXIN 250 MG PO CAPS: 250.0000 mg | ORAL_CAPSULE | Freq: Two times a day (BID) | ORAL | Status: DC

## 2011-09-12 NOTE — ED Provider Notes (Signed)
History    This chart was scribed for Timothy Racer, MD, MD by Timothy Boone. The patient was seen in room TR10C and the patient's care was started at 10:33AM.   CSN: [id number]  Arrival date & time 09/12/11  1015   First MD Initiated Contact with Patient 09/12/11 1020      Chief Complaint  Patient presents with  . Cyst    (Consider location/radiation/quality/duration/timing/severity/associated sxs/prior treatment) The history is provided by the patient. No language interpreter was used.   Timothy Boone is a 46 y.o. male who presents to the Emergency Department complaining of 2 abscesses in groin onset 1 week ago. Pt reports that pain is 9/10. Reports drainage. Reports hx of abscess on leg. Denies fever, chills, n/v/d and any other pain currently.   Past Medical History  Diagnosis Date  . No significant past medical history   . Disc degeneration   . Disc degeneration   . Blind right eye     No past surgical history on file.  No family history on file.  History  Substance Use Topics  . Smoking status: Current Everyday Smoker -- 0.5 packs/day    Types: Cigarettes  . Smokeless tobacco: Not on file  . Alcohol Use: No      Review of Systems  Constitutional: Negative for fever and chills.  Respiratory: Negative for shortness of breath.   Gastrointestinal: Negative for nausea and vomiting.  Neurological: Negative for weakness.    Allergies  Chicken allergy and Penicillins  Home Medications   Current Outpatient Rx  Name Route Sig Dispense Refill  . IBUPROFEN 200 MG PO TABS Oral Take 200 mg by mouth every 6 (six) hours as needed. For pain    . METHOCARBAMOL 500 MG PO TABS Oral Take 1,500 mg by mouth 4 (four) times daily as needed. As needed for muscle spasms.    Marland Kitchen CLINDAMYCIN HCL 150 MG PO CAPS Oral Take 2 capsules (300 mg total) by mouth every 6 (six) hours. 28 capsule 0  . CLOTRIMAZOLE 1 % EX CREA  Apply to affected area 2 times daily 15 g 0  .  HYDROCODONE-ACETAMINOPHEN 5-325 MG PO TABS Oral Take 2 tablets by mouth every 4 (four) hours as needed for pain. 10 tablet 0    BP 129/84  Pulse 83  Temp 98.5 F (36.9 C)  Resp 18  SpO2 97%  Physical Exam  Nursing note and vitals reviewed. Constitutional: He is oriented to person, place, and time. He appears well-developed and well-nourished.  HENT:  Head: Normocephalic and atraumatic.  Pulmonary/Chest: Effort normal. No respiratory distress.  Neurological: He is alert and oriented to person, place, and time.  Skin:       Indurated mass lateral to penis with focal point Indurated mass at base of scrotum without focal point and without fluctuance    Psychiatric: He has a normal mood and affect. His behavior is normal.    ED Course  INCISION AND DRAINAGE Date/Time: 09/12/2011 11:20 AM Performed by: Timothy Boone Authorized by: Ranae Palms, Hoke Baer Consent: Verbal consent obtained. Type: abscess Body area: anogenital Location details: scrotal wall Anesthesia: local infiltration Local anesthetic: lidocaine 1% with epinephrine Anesthetic total: 3 ml Patient sedated: no Scalpel size: 11 Incision type: single straight Complexity: simple Drainage: purulent Drainage amount: scant Wound treatment: wound left open Packing material: none Patient tolerance: Patient tolerated the procedure well with no immediate complications.   (including critical care time) DIAGNOSTIC STUDIES: Oxygen Saturation is 97% on room air, normal  by my interpretation.    COORDINATION OF CARE: 10:35 AM Discussed pt ED treatment with pt     Labs Reviewed - No data to display No results found.   1. Abscess of scrotal wall   2. Tinea cruris       MDM  I personally performed the services described in this documentation, which was scribed in my presence. The recorded information has been reviewed and considered.  Larger of the two abscesses incised with minimal amount of purulence. Suspect  firmness due to induration. Pt advised to return for worsening symptoms and that second abscess may need to be drained in the future. Pt acknowledges understanding  Timothy Racer, MD 09/12/11 1124

## 2011-09-12 NOTE — ED Notes (Signed)
Pt here for 2 abscess to groin, sts hx of same to leg.

## 2011-11-25 ENCOUNTER — Emergency Department (HOSPITAL_COMMUNITY): Payer: Medicare Other

## 2011-11-25 ENCOUNTER — Encounter (HOSPITAL_COMMUNITY): Payer: Self-pay | Admitting: *Deleted

## 2011-11-25 ENCOUNTER — Emergency Department (HOSPITAL_COMMUNITY)
Admission: EM | Admit: 2011-11-25 | Discharge: 2011-11-25 | Disposition: A | Discharge status: Home / Self Care | Payer: Medicare Other | Attending: Emergency Medicine | Admitting: Emergency Medicine

## 2011-11-25 DIAGNOSIS — H5440 Blindness, one eye, unspecified eye: Secondary | ICD-10-CM | POA: Insufficient documentation

## 2011-11-25 DIAGNOSIS — IMO0002 Reserved for concepts with insufficient information to code with codable children: Secondary | ICD-10-CM | POA: Insufficient documentation

## 2011-11-25 DIAGNOSIS — M25579 Pain in unspecified ankle and joints of unspecified foot: Secondary | ICD-10-CM | POA: Insufficient documentation

## 2011-11-25 DIAGNOSIS — F17200 Nicotine dependence, unspecified, uncomplicated: Secondary | ICD-10-CM | POA: Insufficient documentation

## 2011-11-25 MED ORDER — HYDROCODONE-ACETAMINOPHEN 5-500 MG PO TABS: 1.0000 | ORAL_TABLET | Freq: Four times a day (QID) | ORAL | Status: DC | PRN

## 2011-11-25 MED ORDER — NAPROXEN 375 MG PO TABS: 375.0000 mg | ORAL_TABLET | Freq: Two times a day (BID) | ORAL | Status: DC

## 2011-11-25 NOTE — ED Provider Notes (Addendum)
History     CSN: [id number]  Arrival date & time 11/25/11  0865   First MD Initiated Contact with Patient 11/25/11 [phone]      Chief Complaint  Patient presents with  . Ankle Pain    (Consider location/radiation/quality/duration/timing/severity/associated sxs/prior treatment) Patient is a 46 y.o. male presenting with ankle pain. The history is provided by the patient.  Ankle Pain  The incident occurred more than 2 days ago. The incident occurred at home. There was no injury mechanism. The pain is present in the left ankle. The quality of the pain is described as aching. The pain is at a severity of 2/10. The pain is mild. The pain has been constant since onset.    Past Medical History  Diagnosis Date  . No significant past medical history   . Disc degeneration   . Disc degeneration   . Blind right eye     History reviewed. No pertinent past surgical history.  No family history on file.  History  Substance Use Topics  . Smoking status: Current Every Day Smoker -- 0.5 packs/day    Types: Cigarettes  . Smokeless tobacco: Not on file  . Alcohol Use: No      Review of Systems  Musculoskeletal: Positive for arthralgias.  All other systems reviewed and are negative.    Allergies  Chicken allergy and Penicillins  Home Medications   Current Outpatient Rx  Name  Route  Sig  Dispense  Refill  . IBUPROFEN 200 MG PO TABS   Oral   Take 400 mg by mouth every 6 (six) hours as needed. For pain         . METHOCARBAMOL 500 MG PO TABS   Oral   Take 1,500 mg by mouth 4 (four) times daily as needed. As needed for muscle spasms.           BP 133/83  Pulse 75  Temp 97.5 F (36.4 C) (Oral)  Resp 16  SpO2 99%  Physical Exam  Constitutional: He is oriented to person, place, and time. He appears well-developed and well-nourished.  HENT:  Head: Normocephalic and atraumatic.  Eyes: Conjunctivae normal are normal. Pupils are equal, round, and reactive to light.  Neck:  Normal range of motion. Neck supple.  Cardiovascular: Normal rate, regular rhythm, normal heart sounds and intact distal pulses.   Pulmonary/Chest: Effort normal and breath sounds normal.  Abdominal: Soft. Bowel sounds are normal.  Musculoskeletal: He exhibits edema.       bilat malleoli edema and tenderness  Neurological: He is alert and oriented to person, place, and time.  Skin: Skin is warm and dry.  Psychiatric: He has a normal mood and affect. His behavior is normal. Judgment and thought content normal.    ED Course  Procedures (including critical care time)  Labs Reviewed - No data to display No results found.   No diagnosis found.    MDM  + ankle swelling pain.  Will analgesia,  Xray,  reassess  No sxs of infection.  Will analgesia, ortho fu,  Ret new/worsening sxs      Mikenzi Raysor Lytle Michaels, MD 11/25/11 0449  Kanisha Duba Lytle Michaels, MD 11/25/11 [phone]

## 2011-11-25 NOTE — ED Notes (Signed)
Pt to ED c/o L ankle pain since Wed.  Denies injuring it recently.  Ankle swollen at joint.

## 2011-12-04 ENCOUNTER — Emergency Department (HOSPITAL_COMMUNITY)
Admission: EM | Admit: 2011-12-04 | Discharge: 2011-12-05 | Disposition: A | Discharge status: Home / Self Care | Payer: Medicare Other | Attending: Emergency Medicine | Admitting: Emergency Medicine

## 2011-12-04 ENCOUNTER — Encounter (HOSPITAL_COMMUNITY): Payer: Self-pay | Admitting: *Deleted

## 2011-12-04 DIAGNOSIS — F17200 Nicotine dependence, unspecified, uncomplicated: Secondary | ICD-10-CM | POA: Insufficient documentation

## 2011-12-04 DIAGNOSIS — M25572 Pain in left ankle and joints of left foot: Secondary | ICD-10-CM

## 2011-12-04 DIAGNOSIS — H5440 Blindness, one eye, unspecified eye: Secondary | ICD-10-CM | POA: Insufficient documentation

## 2011-12-04 DIAGNOSIS — M25579 Pain in unspecified ankle and joints of unspecified foot: Secondary | ICD-10-CM | POA: Insufficient documentation

## 2011-12-04 DIAGNOSIS — IMO0002 Reserved for concepts with insufficient information to code with codable children: Secondary | ICD-10-CM | POA: Insufficient documentation

## 2011-12-04 MED ORDER — HYDROCODONE-ACETAMINOPHEN 5-325 MG PO TABS: 1.0000 | ORAL_TABLET | ORAL | Status: DC | PRN

## 2011-12-04 NOTE — ED Provider Notes (Signed)
History     CSN: [id number]  Arrival date & time 12/04/11  2144   First MD Initiated Contact with Patient 12/04/11 2336      Chief Complaint  Patient presents with  . Ankle Pain    (Consider location/radiation/quality/duration/timing/severity/associated sxs/prior treatment) HPI History provided by pt and prior chart.  Per prior chart, pt seen in ED on 11/25/11 for 2+ days of non-traumatic left ankle pain and edema.  Xray neg for fx/dislocation, pt treated symptomatically and referred to ortho.  Returns to ER today because he continues to have pain and edema that is aggravation by dorsiflexion and associated w/ paresthesias of foot.  Able to bear weight.  Has been icing, elevating and taking aleve w/out relief.  Denies fever and skin changes.  Past Medical History  Diagnosis Date  . No significant past medical history   . Disc degeneration   . Disc degeneration   . Blind right eye     History reviewed. No pertinent past surgical history.  No family history on file.  History  Substance Use Topics  . Smoking status: Current Every Day Smoker -- 0.5 packs/day    Types: Cigarettes  . Smokeless tobacco: Not on file  . Alcohol Use: No      Review of Systems  All other systems reviewed and are negative.    Allergies  Chicken allergy and Penicillins  Home Medications   Current Outpatient Rx  Name  Route  Sig  Dispense  Refill  . HYDROCODONE-ACETAMINOPHEN 5-500 MG PO TABS   Oral   Take 1-2 tablets by mouth every 6 (six) hours as needed for pain.   15 tablet   0   . METHOCARBAMOL 500 MG PO TABS   Oral   Take 1,500 mg by mouth 4 (four) times daily as needed. As needed for muscle spasms.         Marland Kitchen HYDROCODONE-ACETAMINOPHEN 5-325 MG PO TABS   Oral   Take 1 tablet by mouth every 4 (four) hours as needed for pain.   15 tablet   0   . IBUPROFEN 200 MG PO TABS   Oral   Take 400 mg by mouth every 6 (six) hours as needed. For pain         . NAPROXEN 375 MG PO  TABS   Oral   Take 1 tablet (375 mg total) by mouth 2 (two) times daily.   20 tablet   0     BP 120/77  Pulse 68  Temp 98.8 F (37.1 C) (Oral)  Resp 18  SpO2 100%  Physical Exam  Nursing note and vitals reviewed. Constitutional: He is oriented to person, place, and time. He appears well-developed and well-nourished. No distress.  HENT:  Head: Normocephalic and atraumatic.  Eyes:       Normal appearance  Neck: Normal range of motion.  Pulmonary/Chest: Effort normal.  Musculoskeletal: Normal range of motion.       Mild edema at left lateral malleolus.  No overlying skin changes.  Tenderness inferior to lateral malleolus.  Pt reports pain w/ dorsiflexion of foot only, but does not appear uncomfortable when distracted.  No edema or tenderness of left lower leg.  2+ DP pulse and distal sensation intact.    Neurological: He is alert and oriented to person, place, and time.  Psychiatric: He has a normal mood and affect. His behavior is normal.    ED Course  Procedures (including critical care time)  Labs Reviewed - No  data to display No results found.   1. Pain in left ankle       MDM  46yo M presents for second time w/ non-traumatic L ankle pain and edema.  Low clinical suspicion for joint infection; will treat symptomatically for sprain.  Ortho tech provided w/ ASO and I recommended that pt continue to take NSAID and ice/elevate and f/u with the orthopedist he was referred to on 11/22.  Prescribed 15 vicodin.  Return precautions discussed.  12:03 AM         Otilio Miu, PA-C 12/05/11 0004

## 2011-12-04 NOTE — ED Notes (Signed)
PT has returned tonight for recheck of LT ankle pain. Last seen in ED was 11-25-11 . Pt reports swelling has gone down.

## 2011-12-05 NOTE — ED Provider Notes (Signed)
Medical screening examination/treatment/procedure(s) were performed by non-physician practitioner and as supervising physician I was immediately available for consultation/collaboration.    Vida Roller, MD 12/05/11 [phone]

## 2011-12-05 NOTE — ED Notes (Signed)
Ortho tech notified of orders. 

## 2012-01-03 ENCOUNTER — Emergency Department (HOSPITAL_COMMUNITY): Payer: Medicare Other

## 2012-01-03 ENCOUNTER — Encounter (HOSPITAL_COMMUNITY): Payer: Self-pay | Admitting: *Deleted

## 2012-01-03 ENCOUNTER — Emergency Department (HOSPITAL_COMMUNITY)
Admission: EM | Admit: 2012-01-03 | Discharge: 2012-01-03 | Disposition: A | Discharge status: Home / Self Care | Payer: Medicare Other | Attending: Emergency Medicine | Admitting: Emergency Medicine

## 2012-01-03 DIAGNOSIS — J069 Acute upper respiratory infection, unspecified: Secondary | ICD-10-CM | POA: Insufficient documentation

## 2012-01-03 DIAGNOSIS — H5440 Blindness, one eye, unspecified eye: Secondary | ICD-10-CM | POA: Insufficient documentation

## 2012-01-03 DIAGNOSIS — R05 Cough: Secondary | ICD-10-CM

## 2012-01-03 DIAGNOSIS — F17200 Nicotine dependence, unspecified, uncomplicated: Secondary | ICD-10-CM | POA: Insufficient documentation

## 2012-01-03 DIAGNOSIS — Z8739 Personal history of other diseases of the musculoskeletal system and connective tissue: Secondary | ICD-10-CM | POA: Insufficient documentation

## 2012-01-03 DIAGNOSIS — R059 Cough, unspecified: Secondary | ICD-10-CM | POA: Insufficient documentation

## 2012-01-03 DIAGNOSIS — J3489 Other specified disorders of nose and nasal sinuses: Secondary | ICD-10-CM | POA: Insufficient documentation

## 2012-01-03 NOTE — ED Notes (Signed)
PT has cough that is bad in the morning and at nite since Saturday.  Only gets sob while coughing

## 2012-01-03 NOTE — ED Notes (Addendum)
Correction to Departure Condition: PT NOT GIVEN PRESCRIPTIONS Pt ambulatory leaving ED. Pt not given any prescriptions but educated on at home cough management. Pt verbalized understanding of teaching and has no further questions upon d/c. Pt does not appear to be in acute distress upon d/c.

## 2012-01-03 NOTE — ED Provider Notes (Signed)
History   This chart was scribed for Suzi Roots, MD by Charolett Bumpers, ED Scribe. The patient was seen in room TR09C/TR09C. Patient's care was started at 87.   CSN: [id number]  Arrival date & time 01/03/12  4098   First MD Initiated Contact with Patient 01/03/12 1908      Chief Complaint  Patient presents with  . Cough    The history is provided by the patient. No language interpreter was used.   Timothy Boone is a 46 y.o. male who presents to the Emergency Department complaining of intermittent, episodic dry, non-productive cough for the past 2 days. He states that he has had associated upper respiratory congestion and slight  rhinorrhea. He denies any sore throat, body aches, fevers, headache, chest pain. He denies any h/o lung disease. He denies any gi symptoms, vomiting or diarrhea. He is a smoker. No known ill contacts.     Past Medical History  Diagnosis Date  . No significant past medical history   . Disc degeneration   . Disc degeneration   . Blind right eye     History reviewed. No pertinent past surgical history.  No family history on file.  History  Substance Use Topics  . Smoking status: Current Every Day Smoker -- 0.5 packs/day    Types: Cigarettes  . Smokeless tobacco: Not on file  . Alcohol Use: No      Review of Systems  Constitutional: Negative for fever and chills.  HENT: Positive for congestion and rhinorrhea. Negative for sore throat.   Respiratory: Positive for cough.   Cardiovascular: Negative for chest pain.  Gastrointestinal: Negative for vomiting and diarrhea.  Musculoskeletal: Negative for myalgias.  Neurological: Negative for headaches.  All other systems reviewed and are negative.    Allergies  Chicken allergy and Penicillins  Home Medications   Current Outpatient Rx  Name  Route  Sig  Dispense  Refill  . HYDROCODONE-ACETAMINOPHEN 5-325 MG PO TABS   Oral   Take 1 tablet by mouth every 4 (four) hours as needed  for pain.   15 tablet   0   . HYDROCODONE-ACETAMINOPHEN 5-500 MG PO TABS   Oral   Take 1-2 tablets by mouth every 6 (six) hours as needed for pain.   15 tablet   0   . IBUPROFEN 200 MG PO TABS   Oral   Take 400 mg by mouth every 6 (six) hours as needed. For pain         . METHOCARBAMOL 500 MG PO TABS   Oral   Take 1,500 mg by mouth 4 (four) times daily as needed. As needed for muscle spasms.         Marland Kitchen NAPROXEN 375 MG PO TABS   Oral   Take 1 tablet (375 mg total) by mouth 2 (two) times daily.   20 tablet   0     BP 128/82  Pulse 75  Temp 98.4 F (36.9 C) (Oral)  Resp 18  SpO2 100%  Physical Exam  Nursing note and vitals reviewed. Constitutional: He is oriented to person, place, and time. He appears well-developed and well-nourished. No distress.  HENT:  Head: Normocephalic and atraumatic.  Nose: Nose normal.  Mouth/Throat: Oropharynx is clear and moist.       Upper respiratory congestion.   Eyes: Conjunctivae normal are normal. No scleral icterus.  Neck: Neck supple. No tracheal deviation present.  Cardiovascular: Normal rate, regular rhythm and normal heart sounds.  No murmur heard. Pulmonary/Chest: Effort normal and breath sounds normal. No respiratory distress. He has no wheezes.       Cough on exam. Mild upper resp congestion.   Abdominal: There is no tenderness.  Musculoskeletal: Normal range of motion. He exhibits no edema and no tenderness.  Neurological: He is alert and oriented to person, place, and time.  Skin: Skin is warm and dry. He is not diaphoretic.  Psychiatric: He has a normal mood and affect. His behavior is normal.    ED Course  Procedures (including critical care time)  DIAGNOSTIC STUDIES: Oxygen Saturation is 100% on room air, normal by my interpretation.    COORDINATION OF CARE:  19:18-Discussed planned course of treatment with the patient including a chest x-ray, who is agreeable at this time.  No results found for this or any  previous visit. Dg Chest 2 View  01/03/2012  *RADIOLOGY REPORT*  Clinical Data: Congested with cough.  CHEST - 2 VIEW  Comparison: 06/15/2003  Findings: The lungs are clear without focal infiltrate, edema, pneumothorax or pleural effusion. Interstitial markings are diffusely coarsened with chronic features. The cardiopericardial silhouette is within normal limits for size. Imaged bony structures of the thorax are intact.  IMPRESSION: No acute cardiopulmonary findings.   Original Report Authenticated By: Kennith Center, M.D.         MDM  I personally performed the services described in this documentation, which was scribed in my presence. The recorded information has been reviewed and is accurate.  cxr from triage.   No resp distress or increased wob.        Suzi Roots, MD 01/03/12 [phone]

## 2012-01-03 NOTE — ED Notes (Signed)
Pt returned from X-ray.  

## 2012-01-21 ENCOUNTER — Emergency Department (HOSPITAL_COMMUNITY)
Admission: EM | Admit: 2012-01-21 | Discharge: 2012-01-21 | Disposition: A | Discharge status: Home / Self Care | Payer: Medicare Other | Attending: Emergency Medicine | Admitting: Emergency Medicine

## 2012-01-21 ENCOUNTER — Encounter (HOSPITAL_COMMUNITY): Payer: Self-pay | Admitting: *Deleted

## 2012-01-21 DIAGNOSIS — Z79899 Other long term (current) drug therapy: Secondary | ICD-10-CM | POA: Insufficient documentation

## 2012-01-21 DIAGNOSIS — F17200 Nicotine dependence, unspecified, uncomplicated: Secondary | ICD-10-CM | POA: Insufficient documentation

## 2012-01-21 DIAGNOSIS — K089 Disorder of teeth and supporting structures, unspecified: Secondary | ICD-10-CM | POA: Insufficient documentation

## 2012-01-21 DIAGNOSIS — K0889 Other specified disorders of teeth and supporting structures: Secondary | ICD-10-CM

## 2012-01-21 DIAGNOSIS — K029 Dental caries, unspecified: Secondary | ICD-10-CM

## 2012-01-21 DIAGNOSIS — H543 Unqualified visual loss, both eyes: Secondary | ICD-10-CM | POA: Insufficient documentation

## 2012-01-21 DIAGNOSIS — Z8739 Personal history of other diseases of the musculoskeletal system and connective tissue: Secondary | ICD-10-CM | POA: Insufficient documentation

## 2012-01-21 MED ORDER — OXYCODONE-ACETAMINOPHEN 5-325 MG PO TABS: 1.0000 | ORAL_TABLET | ORAL | Status: DC | PRN

## 2012-01-21 MED ORDER — CLINDAMYCIN HCL 150 MG PO CAPS: 450.0000 mg | ORAL_CAPSULE | Freq: Three times a day (TID) | ORAL | Status: DC

## 2012-01-21 MED ORDER — OXYCODONE-ACETAMINOPHEN 5-325 MG PO TABS
2.0000 | ORAL_TABLET | Freq: Once | ORAL | Status: AC
  Administered 2012-01-21: 2 via ORAL
  Filled 2012-01-21: qty 2

## 2012-01-21 NOTE — ED Notes (Signed)
Pt denies fever/chills. Pt denies N/V/D. Pt denies numbness/tingling. Pt denies headache. Pt localizes pain to bottom teeth. Pt mentating appropriately.

## 2012-01-21 NOTE — ED Notes (Signed)
Pt has been instructed not to drive. Pts significant other states she will be driving home.

## 2012-01-21 NOTE — ED Notes (Addendum)
States, "I have an abscessed tooth", intermitant tooth pain x2 weeks, worse last night and tonight. Pinpoints to lower centrals. Describes as pain and pressure. Unable to sleep. (Denies: fever, nausea, dizziness, drainage, pressure radiates leftward. Has taken ibuprofen. Last had 600mg  at 1500.

## 2012-01-21 NOTE — ED Notes (Signed)
Pt ambulatory leaving ED with significant other. Pt given d/c teaching and prescriptions. Pt verbalized understanding of d/c teaching. Pt has no further questions upon d/c. Pt educated on importance of follow up care with dentist. Pt does not appear in acute distress upon d/c.

## 2012-01-21 NOTE — ED Provider Notes (Signed)
History  This chart was scribed for non-physician practitioner, Dierdre Forth, PA-c working with Carleene Cooper III, MD by Shari Heritage, ED Scribe. This patient was seen in room TR08C/TR08C and the patient's care was started at 2125.   CSN: [id number]  Arrival date & time 01/21/12  1919   First MD Initiated Contact with Patient 01/21/12 2125      Chief Complaint  Patient presents with  . Dental Pain     The history is provided by the patient. No language interpreter was used.    HPI Comments: Timothy Boone is a 47 y.o. male who presents to the Emergency Department complaining of moderate to severe, non-radiating, mid lower dental pain onset 2 weeks ago. Patient states that pain was waxing and waning until last night when it worsened rapidly and stayed constant. He denies fever, chills, headache, jaw pain, nausea, vomiting or drainage. He states that pain is so severe that it is difficult him for him to sleep and eat. Patient has taken acetaminophen, ibuprofen and Excedrin with minimal relief. Patient has a history of ruptured cervical discs. He does not take any medicines on a regular basis. He is allergic to penicillin. He states he does not have a regular dentist or PCP.   Past Medical History  Diagnosis Date  . No significant past medical history   . Disc degeneration   . Disc degeneration   . Blind right eye     Past Surgical History  Procedure Date  . Degenerative cervical disc     No family history on file.  History  Substance Use Topics  . Smoking status: Current Every Day Smoker -- 0.5 packs/day    Types: Cigarettes  . Smokeless tobacco: Not on file  . Alcohol Use: No      Review of Systems  Constitutional: Negative for fever, chills and appetite change.  HENT: Positive for dental problem. Negative for nosebleeds, facial swelling, rhinorrhea, drooling, trouble swallowing, neck pain, neck stiffness and postnasal drip.   Eyes: Negative for pain and  redness.  Respiratory: Negative for cough and wheezing.   Cardiovascular: Negative for chest pain.  Gastrointestinal: Negative for nausea, vomiting and abdominal pain.  Skin: Negative for color change and rash.  Neurological: Negative for weakness, light-headedness and headaches.  All other systems reviewed and are negative.    Allergies  Chicken allergy and Penicillins  Home Medications   Current Outpatient Rx  Name  Route  Sig  Dispense  Refill  . IBUPROFEN 200 MG PO TABS   Oral   Take 600 mg by mouth every 6 (six) hours as needed. For pain         . CLINDAMYCIN HCL 150 MG PO CAPS   Oral   Take 3 capsules (450 mg total) by mouth 3 (three) times daily.   90 capsule   0   . OXYCODONE-ACETAMINOPHEN 5-325 MG PO TABS   Oral   Take 1 tablet by mouth every 4 (four) hours as needed for pain.   20 tablet   0     Triage Vitals: BP 131/79  Pulse 80  Temp 97.5 F (36.4 C) (Oral)  Resp 18  SpO2 98%  Physical Exam  Nursing note and vitals reviewed. Constitutional: He is oriented to person, place, and time. He appears well-developed and well-nourished.  HENT:  Head: Normocephalic.  Right Ear: Tympanic membrane, external ear and ear canal normal.  Left Ear: Tympanic membrane, external ear and ear canal normal.  Nose: Nose  normal. Right sinus exhibits no maxillary sinus tenderness and no frontal sinus tenderness. Left sinus exhibits no maxillary sinus tenderness and no frontal sinus tenderness.  Mouth/Throat: Uvula is midline, oropharynx is clear and moist and mucous membranes are normal. No oral lesions. Abnormal dentition. Dental caries present. No dental abscesses, uvula swelling or lacerations. No oropharyngeal exudate, posterior oropharyngeal edema, posterior oropharyngeal erythema or tonsillar abscesses.  Eyes: Conjunctivae normal are normal. Right eye exhibits no discharge. Left eye exhibits no discharge.  Neck: Normal range of motion. Neck supple.  Cardiovascular:  Normal rate, regular rhythm and normal heart sounds.   Pulmonary/Chest: Effort normal and breath sounds normal. No respiratory distress. He has no wheezes. He has no rales. He exhibits no tenderness.  Abdominal: Soft. Bowel sounds are normal. He exhibits no distension. There is no tenderness. There is no rebound and no guarding.  Musculoskeletal: He exhibits no edema.  Lymphadenopathy:    He has no cervical adenopathy.  Neurological: He is alert and oriented to person, place, and time. He exhibits normal muscle tone. Coordination normal.  Skin: Skin is warm and dry. No rash noted. No erythema.  Psychiatric: He has a normal mood and affect.    ED Course  Dental Block Date/Time: 01/21/2012 9:38 PM Performed by: Dierdre Forth Authorized by: Osvaldo Human Consent: Verbal consent obtained. Risks and benefits: risks, benefits and alternatives were discussed Consent given by: patient Patient understanding: patient states understanding of the procedure being performed Patient consent: the patient's understanding of the procedure matches consent given Procedure consent: procedure consent matches procedure scheduled Site marked: the operative site was not marked Patient identity confirmed: verbally with patient Local anesthesia used: yes Anesthesia: local infiltration Local anesthetic: bupivacaine 0.5% with epinephrine Anesthetic total: 0.75 ml Patient sedated: no Patient tolerance: Patient tolerated the procedure well with no immediate complications. Comments: Dental block of teeth #26, 25, 24,23 Pt tolerated procedure with good results and no complications   (including critical care time) DIAGNOSTIC STUDIES: Oxygen Saturation is 98% on room air, normal by my interpretation.    COORDINATION OF CARE: 9:37 PM- Patient informed of current plan for treatment and evaluation and agrees with plan at this time.      Labs Reviewed - No data to display No results found.   1.  Pain, dental   2. Dental caries       MDM  Timothy Boone presents with dental pain and caries.  Patient with toothache.  No gross abscess.  Exam unconcerning for Ludwig's angina or spread of infection.  Pt given dental block with significant improvement in pain.  Will treat with clinda and pain medicine.  Urged patient to follow-up with dentist.    1. Medications: clindamycin, usual home medications 2. Treatment: rest, drink plenty of fluids, take medications as prescribed 3. Follow Up: Please followup with your primary doctor for discussion of your diagnoses and further evaluation after today's visit; if you do not have a primary care doctor use the resource guide provided to find one; followup with dentist on Monday morning      I personally performed the services described in this documentation, which was scribed in my presence. The recorded information has been reviewed and is accurate.   Dahlia Client Dynisha Due, PA-C 01/21/12 2202

## 2012-01-22 NOTE — ED Provider Notes (Signed)
Medical screening examination/treatment/procedure(s) were performed by non-physician practitioner and as supervising physician I was immediately available for consultation/collaboration.   Carleene Cooper III, MD 01/22/12 1324

## 2012-04-24 ENCOUNTER — Encounter (HOSPITAL_COMMUNITY): Payer: Self-pay | Admitting: Emergency Medicine

## 2012-04-24 ENCOUNTER — Emergency Department (HOSPITAL_COMMUNITY)
Admission: EM | Admit: 2012-04-24 | Discharge: 2012-04-24 | Disposition: A | Discharge status: Home / Self Care | Payer: Medicare Other | Attending: Emergency Medicine | Admitting: Emergency Medicine

## 2012-04-24 DIAGNOSIS — K089 Disorder of teeth and supporting structures, unspecified: Secondary | ICD-10-CM | POA: Insufficient documentation

## 2012-04-24 DIAGNOSIS — K0889 Other specified disorders of teeth and supporting structures: Secondary | ICD-10-CM

## 2012-04-24 DIAGNOSIS — IMO0002 Reserved for concepts with insufficient information to code with codable children: Secondary | ICD-10-CM | POA: Insufficient documentation

## 2012-04-24 DIAGNOSIS — H5440 Blindness, one eye, unspecified eye: Secondary | ICD-10-CM | POA: Insufficient documentation

## 2012-04-24 DIAGNOSIS — F17200 Nicotine dependence, unspecified, uncomplicated: Secondary | ICD-10-CM | POA: Insufficient documentation

## 2012-04-24 DIAGNOSIS — R229 Localized swelling, mass and lump, unspecified: Secondary | ICD-10-CM | POA: Insufficient documentation

## 2012-04-24 MED ORDER — OXYCODONE-ACETAMINOPHEN 5-325 MG PO TABS: 1.0000 | ORAL_TABLET | Freq: Four times a day (QID) | ORAL | Status: DC | PRN

## 2012-04-24 MED ORDER — CLINDAMYCIN HCL 150 MG PO CAPS: 450.0000 mg | ORAL_CAPSULE | Freq: Three times a day (TID) | ORAL | Status: DC

## 2012-04-24 NOTE — ED Notes (Signed)
Pt to ED with c/o tooth abscess onset last night. No fever. Pt states eat without difficulty.

## 2012-04-24 NOTE — ED Provider Notes (Signed)
History     CSN: [id number]  Arrival date & time 04/24/12  4098   First MD Initiated Contact with Patient 04/24/12 1015      Chief Complaint  Patient presents with  . Oral Swelling    (Consider location/radiation/quality/duration/timing/severity/associated sxs/prior treatment) HPI Comments: Patient presents with a chief complaint of dental pain.  Pain located over his front two lower incisors.  Pain has been present since last evening.  He has been taken Ibuprofen for the pain without relief.  He denies any dental injury.  He does not have a dentist.  Patient is a 47 y.o. male presenting with tooth pain. The history is provided by the patient.  Dental PainThe primary symptoms include mouth pain. Primary symptoms do not include dental injury, oral bleeding, oral lesions or fever. The symptoms are worsening. The symptoms occur constantly.  Additional symptoms include: dental sensitivity to temperature, gum swelling and gum tenderness. Additional symptoms do not include: purulent gums, trismus, facial swelling and trouble swallowing.    Past Medical History  Diagnosis Date  . No significant past medical history   . Disc degeneration   . Disc degeneration   . Blind right eye     Past Surgical History  Procedure Laterality Date  . Degenerative cervical disc      No family history on file.  History  Substance Use Topics  . Smoking status: Current Every Day Smoker -- 0.50 packs/day    Types: Cigarettes  . Smokeless tobacco: Not on file  . Alcohol Use: No      Review of Systems  Constitutional: Negative for fever and chills.  HENT: Positive for dental problem. Negative for facial swelling, trouble swallowing, neck pain and neck stiffness.   All other systems reviewed and are negative.    Allergies  Chicken allergy and Penicillins  Home Medications   Current Outpatient Rx  Name  Route  Sig  Dispense  Refill  . clindamycin (CLEOCIN) 150 MG capsule   Oral   Take 3  capsules (450 mg total) by mouth 3 (three) times daily.   90 capsule   0   . ibuprofen (ADVIL,MOTRIN) 200 MG tablet   Oral   Take 600 mg by mouth every 6 (six) hours as needed. For pain         . oxyCODONE-acetaminophen (PERCOCET) 5-325 MG per tablet   Oral   Take 1 tablet by mouth every 4 (four) hours as needed for pain.   20 tablet   0     BP 130/86  Pulse 62  Temp(Src) 97.7 F (36.5 C) (Oral)  SpO2 98%  Physical Exam  Nursing note and vitals reviewed. Constitutional: He is oriented to person, place, and time. He appears well-developed and well-nourished. No distress.  HENT:  Head: Normocephalic and atraumatic. No trismus in the jaw.  Mouth/Throat: Uvula is midline, oropharynx is clear and moist and mucous membranes are normal. Abnormal dentition. No dental abscesses or edematous. No oropharyngeal exudate, posterior oropharyngeal edema, posterior oropharyngeal erythema or tonsillar abscesses.  Poor dental hygiene. Pt able to open and close mouth with out difficulty. Airway intact. Uvula midline. Mild gingival swelling with tenderness over affected area, but no fluctuance. No swelling or tenderness of submental and submandibular regions.  No tongue elevation.    Neck: Normal range of motion and full passive range of motion without pain. Neck supple.  Cardiovascular: Normal rate and regular rhythm.   Pulmonary/Chest: Effort normal and breath sounds normal. No respiratory distress. He  has no wheezes.  Musculoskeletal: Normal range of motion.  Lymphadenopathy:       Head (right side): No submental, no submandibular, no tonsillar, no preauricular and no posterior auricular adenopathy present.       Head (left side): No submental, no submandibular, no tonsillar, no preauricular and no posterior auricular adenopathy present.    He has no cervical adenopathy.  Neurological: He is alert and oriented to person, place, and time.  Skin: Skin is warm and dry. No rash noted. He is not  diaphoretic.    ED Course  Procedures (including critical care time)  Labs Reviewed - No data to display No results found.   No diagnosis found.    MDM  Patient with toothache.  No gross abscess.  Exam unconcerning for Ludwig's angina or spread of infection.  Will treat with penicillin and pain medicine.  Urged patient to follow-up with dentist.          Pascal Lux Paxtang, PA-C 04/24/12 1133

## 2012-04-26 NOTE — ED Provider Notes (Signed)
Medical screening examination/treatment/procedure(s) were performed by non-physician practitioner and as supervising physician I was immediately available for consultation/collaboration.  Donnetta Hutching, MD 04/26/12 [phone]

## 2012-06-10 ENCOUNTER — Emergency Department (HOSPITAL_COMMUNITY)
Admission: EM | Admit: 2012-06-10 | Discharge: 2012-06-10 | Disposition: A | Discharge status: Home / Self Care | Payer: Medicare Other | Attending: Emergency Medicine | Admitting: Emergency Medicine

## 2012-06-10 ENCOUNTER — Encounter (HOSPITAL_COMMUNITY): Payer: Self-pay | Admitting: Emergency Medicine

## 2012-06-10 DIAGNOSIS — Z8739 Personal history of other diseases of the musculoskeletal system and connective tissue: Secondary | ICD-10-CM | POA: Insufficient documentation

## 2012-06-10 DIAGNOSIS — Z8669 Personal history of other diseases of the nervous system and sense organs: Secondary | ICD-10-CM | POA: Insufficient documentation

## 2012-06-10 DIAGNOSIS — F17200 Nicotine dependence, unspecified, uncomplicated: Secondary | ICD-10-CM | POA: Insufficient documentation

## 2012-06-10 DIAGNOSIS — K089 Disorder of teeth and supporting structures, unspecified: Secondary | ICD-10-CM | POA: Insufficient documentation

## 2012-06-10 DIAGNOSIS — Z880 Allergy status to penicillin: Secondary | ICD-10-CM | POA: Insufficient documentation

## 2012-06-10 DIAGNOSIS — K0889 Other specified disorders of teeth and supporting structures: Secondary | ICD-10-CM

## 2012-06-10 DIAGNOSIS — G8929 Other chronic pain: Secondary | ICD-10-CM | POA: Insufficient documentation

## 2012-06-10 DIAGNOSIS — R6884 Jaw pain: Secondary | ICD-10-CM | POA: Insufficient documentation

## 2012-06-10 HISTORY — DX: Reserved for concepts with insufficient information to code with codable children: IMO0002

## 2012-06-10 HISTORY — DX: Other chronic pain: G89.29

## 2012-06-10 HISTORY — DX: Dorsalgia, unspecified: M54.9

## 2012-06-10 MED ORDER — OXYCODONE-ACETAMINOPHEN 5-325 MG PO TABS: 2.0000 | ORAL_TABLET | Freq: Four times a day (QID) | ORAL | Status: DC | PRN

## 2012-06-10 MED ORDER — CLINDAMYCIN HCL 150 MG PO CAPS: 300.0000 mg | ORAL_CAPSULE | Freq: Three times a day (TID) | ORAL | Status: DC

## 2012-06-10 NOTE — ED Notes (Signed)
C/o R lower jaw pain x 1 week.  Pt states he had 7 teeth extracted 8 days ago at a free clinic in [facility].

## 2012-06-10 NOTE — ED Notes (Signed)
Pt denies any questions upon discharge. 

## 2012-06-10 NOTE — ED Provider Notes (Signed)
History    This chart was scribed for Roxy Horseman, non-physician practitioner working with Celene Kras, MD by Leone Payor, ED Scribe. This patient was seen in room TR05C/TR05C and the patient's care was started at 2007.   CSN: [id number]  Arrival date & time 06/10/12  2007   First MD Initiated Contact with Patient 06/10/12 2041      Chief Complaint  Patient presents with  . Dental Pain     The history is provided by the patient. No language interpreter was used.    HPI Comments: Timothy Boone is a 47 y.o. male who presents to the Emergency Department complaining of ongoing, constant R lower jaw pain starting 1 week ago. States he had 7 teeth extracted eight days ago at a free clinic in [place]. He was prescribed oxycotin which he has since finished. Describes the pain as throbbing. Denies any fevers. Pt is a current everyday smoker but denies alcohol use.    Past Medical History  Diagnosis Date  . No significant past medical history   . Disc degeneration   . Disc degeneration   . Blind right eye   . Chronic back pain   . Degenerative disc disease     Past Surgical History  Procedure Laterality Date  . Degenerative cervical disc      No family history on file.  History  Substance Use Topics  . Smoking status: Current Every Day Smoker -- 0.50 packs/day    Types: Cigarettes  . Smokeless tobacco: Not on file  . Alcohol Use: No      Review of Systems A complete 10 system review of systems was obtained and all systems are negative except as noted in the HPI and PMH.   Allergies  Chicken allergy and Penicillins  Home Medications   Current Outpatient Rx  Name  Route  Sig  Dispense  Refill  . clindamycin (CLEOCIN) 150 MG capsule   Oral   Take 3 capsules (450 mg total) by mouth 3 (three) times daily.   90 capsule   0   . clindamycin (CLEOCIN) 150 MG capsule   Oral   Take 3 capsules (450 mg total) by mouth 3 (three) times daily.   90 capsule  0   . oxyCODONE-acetaminophen (PERCOCET/ROXICET) 5-325 MG per tablet   Oral   Take 1-2 tablets by mouth every 6 (six) hours as needed for pain.   15 tablet   0     BP 150/88  Pulse 67  Temp(Src) 98.8 F (37.1 C) (Oral)  Resp 18  SpO2 99%  Physical Exam  Nursing note and vitals reviewed. Constitutional: He is oriented to person, place, and time. He appears well-developed and well-nourished. No distress.  HENT:  Head: Normocephalic and atraumatic.  Mouth/Throat:    Poor dentition throughout.  Affected tooth as diagrammed.  No signs of peritonsillar or tonsillar abscess.  No signs of gingival abscess. Oropharynx is clear and without exudates.  Uvula is midline.  Airway is intact. No signs of Ludwig's angina.   Eyes: EOM are normal.  Neck: Neck supple. No tracheal deviation present.  Cardiovascular: Normal rate.   Pulmonary/Chest: Effort normal. No respiratory distress.  Musculoskeletal: Normal range of motion.  Neurological: He is alert and oriented to person, place, and time.  Skin: Skin is warm and dry.  Psychiatric: He has a normal mood and affect. His behavior is normal.    ED Course  Procedures (including critical care time)  DIAGNOSTIC STUDIES:  Oxygen Saturation is 99% on room air, normal by my interpretation.    COORDINATION OF CARE: 8:45 PM Discussed treatment plan with pt at bedside and pt agreed to plan.   Labs Reviewed - No data to display No results found.   1. Pain, dental       MDM  Patient with uncomplicated dental pain. Treat with antibiotics and pain medicine. Patient is stable and ready for discharge.    I personally performed the services described in this documentation, which was scribed in my presence. The recorded information has been reviewed and is accurate.      Roxy Horseman, PA-C 06/10/12 2351

## 2012-06-12 ENCOUNTER — Emergency Department (HOSPITAL_COMMUNITY): Payer: Medicare Other

## 2012-06-12 ENCOUNTER — Emergency Department (HOSPITAL_COMMUNITY)
Admission: EM | Admit: 2012-06-12 | Discharge: 2012-06-12 | Disposition: A | Discharge status: Home / Self Care | Payer: Medicare Other | Attending: Emergency Medicine | Admitting: Emergency Medicine

## 2012-06-12 ENCOUNTER — Encounter (HOSPITAL_COMMUNITY): Payer: Self-pay | Admitting: Nurse Practitioner

## 2012-06-12 DIAGNOSIS — K0889 Other specified disorders of teeth and supporting structures: Secondary | ICD-10-CM

## 2012-06-12 DIAGNOSIS — K089 Disorder of teeth and supporting structures, unspecified: Secondary | ICD-10-CM | POA: Insufficient documentation

## 2012-06-12 DIAGNOSIS — IMO0002 Reserved for concepts with insufficient information to code with codable children: Secondary | ICD-10-CM | POA: Insufficient documentation

## 2012-06-12 DIAGNOSIS — F17200 Nicotine dependence, unspecified, uncomplicated: Secondary | ICD-10-CM | POA: Insufficient documentation

## 2012-06-12 DIAGNOSIS — G8929 Other chronic pain: Secondary | ICD-10-CM | POA: Insufficient documentation

## 2012-06-12 DIAGNOSIS — Z79899 Other long term (current) drug therapy: Secondary | ICD-10-CM | POA: Insufficient documentation

## 2012-06-12 DIAGNOSIS — H547 Unspecified visual loss: Secondary | ICD-10-CM | POA: Insufficient documentation

## 2012-06-12 NOTE — ED Notes (Addendum)
Had R lower wisdom tooth extraction at dental clinic last Saturday, having pain since that has increased in severity today. Pt is taking clindamycin PO that he was given here earlier this week

## 2012-06-12 NOTE — ED Notes (Signed)
Pt. To x-ray .

## 2012-06-12 NOTE — ED Provider Notes (Signed)
History    This chart was scribed for Junius Finner (PA) non-physician practitioner working with Gwyneth Sprout, MD by Sofie Rower, ED Scribe. This patient was seen in room TR06C/TR06C and the patient's care was started at 8:10PM.   CSN: [id number]  Arrival date & time 06/12/12  1836   First MD Initiated Contact with Patient 06/12/12 2010      Chief Complaint  Patient presents with  . Dental Pain    (Consider location/radiation/quality/duration/timing/severity/associated sxs/prior treatment) The history is provided by the patient and the spouse. No language interpreter was used.    Timothy Boone is a 47 y.o. male , with a hx of degenerative disc disease, chronic back pain and wisdom tooth extraction (Performed on 06/02/12 at the [facility]) who presents to the Emergency Department complaining of sudden, progressively worsening, non radiating dental pain, located at the right lower jaw, onset ten days ago (06/02/12). The pt reports he recently had a wisdom tooth extraction performed at the [facility], last Saturday (06/02/12). The pt's wife informs the pt was evaluated two days ago, where which he was prescribed clindamycin antibitoics, percocet, and ibuprofen, however, the pt is still experiencing a severe amount of pain. The progressively worsening pain has prompted the pt's as well as the pt's wifes concern and desire to seek medical evaluation at [facility] this evening (06/12/12). Modifying factors include opening of the jaw which intensifies the dental pain.    The pt denies difficulty swallowing, difficulty breathing, nausea, vomiting and fever.   The pt is a current everyday smoker, however, he does not drink alcohol.   Pt does not have a PCP.     Past Medical History  Diagnosis Date  . No significant past medical history   . Disc degeneration   . Disc degeneration    . Blind right eye   . Chronic back pain   . Degenerative disc disease     Past Surgical History  Procedure Laterality Date  . Degenerative cervical disc      History reviewed. No pertinent family history.  History  Substance Use Topics  . Smoking status: Current Every Day Smoker -- 0.50 packs/day    Types: Cigarettes  . Smokeless tobacco: Not on file  . Alcohol Use: No      Review of Systems  Constitutional: Negative for fever.  HENT: Positive for dental problem. Negative for trouble swallowing.   Respiratory: Negative for shortness of breath.   Gastrointestinal: Negative for nausea and vomiting.  All other systems reviewed and are negative.    Allergies  Chicken allergy and Penicillins  Home Medications   Current Outpatient Rx  Name  Route  Sig  Dispense  Refill  . clindamycin (CLEOCIN) 150 MG capsule   Oral   Take 2 capsules (300 mg total) by mouth 3 (three) times daily. May dispense as 150mg  capsules   60 capsule   0   . ibuprofen (ADVIL,MOTRIN) 200 MG tablet   Oral   Take 200 mg by mouth every 6 (six) hours as needed for pain.         Marland Kitchen oxyCODONE-acetaminophen (PERCOCET/ROXICET) 5-325 MG per tablet   Oral   Take 2 tablets by mouth every 6 (six) hours as needed for pain.   13 tablet   0     BP 153/89  Pulse 76  Temp(Src) 98.2 F (36.8 C) (Oral)  Resp 16  Ht 6' (1.829 m)  Wt 170 lb (77.111 kg)  BMI 23.05 kg/m2  SpO2 98%  Physical Exam  Nursing note and vitals reviewed. Constitutional: He is oriented to person, place, and time. He appears well-developed and well-nourished. No distress.  HENT:  Head: Normocephalic and atraumatic. No trismus in the jaw.  Right Ear: Hearing, tympanic membrane, external ear and ear canal normal.  Left Ear: Hearing, tympanic membrane, external ear and ear canal normal.  Nose: Nose normal.  Mouth/Throat: Uvula is midline, oropharynx is clear and moist and mucous membranes are normal. He does not have dentures.  No oral lesions. Abnormal dentition. Dental abscesses and dental caries present. No edematous or lacerations. No oropharyngeal exudate, posterior oropharyngeal edema, posterior oropharyngeal erythema or tonsillar abscesses.    Possible abscess in right lower jaw where wisdom tooth extracted.  Scant white discharge at site of extraction.    Eyes: Conjunctivae and EOM are normal. No scleral icterus.  Neck: Normal range of motion. Neck supple. No tracheal deviation present.  Cardiovascular: Normal rate, regular rhythm and normal heart sounds.  Exam reveals no gallop and no friction rub.   No murmur heard. Pulmonary/Chest: Effort normal and breath sounds normal. No respiratory distress. He has no wheezes.  Musculoskeletal: Normal range of motion.  Neurological: He is alert and oriented to person, place, and time.  Skin: Skin is warm and dry.  Psychiatric: He has a normal mood and affect. His behavior is normal.    ED Course  Procedures (including critical care time)  DIAGNOSTIC STUDIES: Oxygen Saturation is 98% on room air, normal by my interpretation.    COORDINATION OF CARE:  8:45 PM- Treatment plan concerning conference with attending Physician (Dr. Gwyneth Sprout) discussed with patient. Pt agrees with treatment.  8:53 PM- Recheck. Treatment plan concerning radiologic evaluation and follow up with oral surgeon discussed with patient. Pt agrees with treatment.  9:59 PM- Recheck. Treatment plan concerning radiology results discussed with patient. Pt agrees with treatment.            Labs Reviewed - No data to display  No results found for this or any previous visit. Dg Orthopantogram  06/12/2012   *RADIOLOGY REPORT*  Clinical Data: Pain at the level of recent wisdom molar extraction.  ORTHOPANTOGRAM/PANORAMIC  Comparison: None.  Findings: Bony defect in the right lower mandible is likely the level of the apparent recent wisdom molar extraction.  This defect has somewhat  irregular margins and measures roughly 11 x 18 mm. Based on appearance, component of infection cannot be excluded. There are very few native teeth with only a few midline incisors present which appear unremarkable.  There is a partial fragment of a tooth in the upper left maxilla.  IMPRESSION: Right mandibular bony defect presumably related to the recent molar extraction.  Margins of this area are somewhat irregular. Component of infection cannot be excluded based on radiographic appearance.   Original Report Authenticated By: Irish Lack, M.D.      1. Pain, dental       MDM  Pt presenting with ongoing pain in right lower jaw since wisdom tooth extraction.  Pt started clindamycin and percocet yesterday and advised pain would subside in 24hours.  Pain has not improved and pt and wife have become frustrated with trying to establish f/u dental care for pain.  Plain films were taken in ED which indicated area of tooth extraction but could not r/o infection.  Provided pt with additional referrerances.F/u with Dr. Leanord Asal, DDS. and  Dr. Barbette Merino, oral surgeon within 24-48 hours. Continue taking clindamycin and percocet as prescribed. Also provided pt with community dental resources form. F/u with PCP, [place] [facility] info provided. Return precautions given. Pt verbalized understanding and agreement with tx plan. Vitals: unremarkable. Discharged in stable condition.    I personally performed the services described in this documentation, which was scribed in my presence. The recorded information has been reviewed and is accurate.      Junius Finner, PA-C 06/13/12 1341

## 2012-06-13 NOTE — ED Provider Notes (Signed)
Medical screening examination/treatment/procedure(s) were performed by non-physician practitioner and as supervising physician I was immediately available for consultation/collaboration.    Celene Kras, MD 06/13/12 1126

## 2012-06-13 NOTE — ED Provider Notes (Signed)
Medical screening examination/treatment/procedure(s) were performed by non-physician practitioner and as supervising physician I was immediately available for consultation/collaboration.   Gwyneth Sprout, MD 06/13/12 2321

## 2012-07-16 ENCOUNTER — Emergency Department (HOSPITAL_COMMUNITY)
Admission: EM | Admit: 2012-07-16 | Discharge: 2012-07-17 | Discharge status: Against Medical Advice | Payer: Medicare Other | Attending: Emergency Medicine | Admitting: Emergency Medicine

## 2012-07-16 DIAGNOSIS — R300 Dysuria: Secondary | ICD-10-CM | POA: Insufficient documentation

## 2012-07-16 DIAGNOSIS — F17200 Nicotine dependence, unspecified, uncomplicated: Secondary | ICD-10-CM | POA: Insufficient documentation

## 2012-07-16 DIAGNOSIS — R109 Unspecified abdominal pain: Secondary | ICD-10-CM | POA: Insufficient documentation

## 2012-07-16 LAB — URINALYSIS, ROUTINE W REFLEX MICROSCOPIC
Glucose, UA: NEGATIVE mg/dL
Ketones, ur: NEGATIVE mg/dL
Leukocytes, UA: NEGATIVE
Protein, ur: NEGATIVE mg/dL

## 2012-07-16 NOTE — ED Notes (Signed)
Pt c/o dysuria and pain in left flank since saturday

## 2012-07-16 NOTE — ED Notes (Signed)
Unable to locate patient to take to exam room

## 2012-07-16 NOTE — ED Notes (Signed)
PT. REQUESTING ANOTHER RN TO TRIAGE HIM  , PT. STATED " I DON'T WANT TO TALK TO YOU".

## 2012-07-17 NOTE — ED Notes (Signed)
Unable to locate pt  

## 2012-08-16 ENCOUNTER — Encounter (HOSPITAL_COMMUNITY): Payer: Self-pay | Admitting: *Deleted

## 2012-08-16 ENCOUNTER — Emergency Department (HOSPITAL_COMMUNITY)
Admission: EM | Admit: 2012-08-16 | Discharge: 2012-08-16 | Disposition: A | Discharge status: Home / Self Care | Payer: Medicare Other | Attending: Emergency Medicine | Admitting: Emergency Medicine

## 2012-08-16 ENCOUNTER — Emergency Department (HOSPITAL_COMMUNITY): Payer: Medicare Other

## 2012-08-16 DIAGNOSIS — H5440 Blindness, one eye, unspecified eye: Secondary | ICD-10-CM | POA: Insufficient documentation

## 2012-08-16 DIAGNOSIS — F17200 Nicotine dependence, unspecified, uncomplicated: Secondary | ICD-10-CM | POA: Insufficient documentation

## 2012-08-16 DIAGNOSIS — M19019 Primary osteoarthritis, unspecified shoulder: Secondary | ICD-10-CM | POA: Insufficient documentation

## 2012-08-16 DIAGNOSIS — Z8739 Personal history of other diseases of the musculoskeletal system and connective tissue: Secondary | ICD-10-CM | POA: Insufficient documentation

## 2012-08-16 DIAGNOSIS — R52 Pain, unspecified: Secondary | ICD-10-CM | POA: Insufficient documentation

## 2012-08-16 DIAGNOSIS — Z880 Allergy status to penicillin: Secondary | ICD-10-CM | POA: Insufficient documentation

## 2012-08-16 DIAGNOSIS — M25511 Pain in right shoulder: Secondary | ICD-10-CM

## 2012-08-16 DIAGNOSIS — G8929 Other chronic pain: Secondary | ICD-10-CM | POA: Insufficient documentation

## 2012-08-16 DIAGNOSIS — M25519 Pain in unspecified shoulder: Secondary | ICD-10-CM | POA: Insufficient documentation

## 2012-08-16 DIAGNOSIS — M549 Dorsalgia, unspecified: Secondary | ICD-10-CM | POA: Insufficient documentation

## 2012-08-16 MED ORDER — OXYCODONE-ACETAMINOPHEN 7.5-325 MG PO TABS: 1.0000 | ORAL_TABLET | Freq: Four times a day (QID) | ORAL | Status: DC | PRN

## 2012-08-16 MED ORDER — NAPROXEN 500 MG PO TABS: 500.0000 mg | ORAL_TABLET | Freq: Two times a day (BID) | ORAL | Status: DC

## 2012-08-16 NOTE — ED Provider Notes (Signed)
CSN: [id number]     Arrival date & time 08/16/12  4098 History    This chart was scribed for Felicie Morn, NP working with Lyanne Co, MD by Quintella Reichert, ED Scribe. This patient was seen in room TR09C/TR09C and the patient's care was started at 8:14 PM.     Chief Complaint  Patient presents with  . Shoulder Pain    Patient is a 47 y.o. male presenting with shoulder pain. The history is provided by the patient. No language interpreter was used.  Shoulder Pain This is a new problem. The current episode started 6 to 12 hours ago. The problem occurs constantly. The problem has been gradually worsening. Pertinent negatives include no chest pain, no abdominal pain, no headaches and no shortness of breath. Exacerbated by: movement. Nothing relieves the symptoms. He has tried nothing for the symptoms.    HPI Comments: Timothy Boone is a 47 y.o. male with h/o degenerative disc disease who presents to the Emergency Department complaining of progressively-worsening right shoulder pain that began last night.  He notes that he mowed the lawn yesterday and may have been pushing the mower harder than usual.  He denies any other injuries or unusual activities that may have brought on pain.  Pain is exacerbated by moving the shoulder and he also states the states the shoulder feels weak.  He states he is unable to move the arm unless he picks it up with his left hand.    Past Medical History  Diagnosis Date  . No significant past medical history   . Disc degeneration   . Disc degeneration   . Blind right eye   . Chronic back pain   . Degenerative disc disease     Past Surgical History  Procedure Laterality Date  . Degenerative cervical disc      History reviewed. No pertinent family history.   History  Substance Use Topics  . Smoking status: Current Every Day Smoker -- 0.50 packs/day    Types: Cigarettes  . Smokeless tobacco: Not on file  . Alcohol Use: No     Review of  Systems  Respiratory: Negative for shortness of breath.   Cardiovascular: Negative for chest pain.  Gastrointestinal: Negative for abdominal pain.  Musculoskeletal: Positive for arthralgias.  Neurological: Negative for headaches.  All other systems reviewed and are negative.      Allergies  Chicken allergy and Penicillins  Home Medications   Current Outpatient Rx  Name  Route  Sig  Dispense  Refill  . ibuprofen (ADVIL,MOTRIN) 200 MG tablet   Oral   Take 200 mg by mouth every 6 (six) hours as needed for pain.         . methocarbamol (ROBAXIN) 750 MG tablet   Oral   Take 750 mg by mouth daily as needed. Muscle pain         . oxyCODONE-acetaminophen (PERCOCET/ROXICET) 5-325 MG per tablet   Oral   Take 2 tablets by mouth every 6 (six) hours as needed for pain.   13 tablet   0   . traMADol (ULTRAM) 50 MG tablet   Oral   Take 50 mg by mouth every 6 (six) hours as needed for pain.          BP 126/71  Pulse 80  Temp(Src) 97.9 F (36.6 C) (Oral)  Resp 20  SpO2 100%  Physical Exam  Nursing note and vitals reviewed. Constitutional: He is oriented to person, place, and time. He  appears well-developed and well-nourished. No distress.  HENT:  Head: Normocephalic and atraumatic.  Eyes: EOM are normal.  Neck: Neck supple. No tracheal deviation present.  Cardiovascular: Normal rate.   Pulses:      Radial pulses are 3+ on the right side.  Pulmonary/Chest: Effort normal. No respiratory distress.  Musculoskeletal:       Right shoulder: He exhibits decreased range of motion and tenderness. He exhibits no swelling, no crepitus and normal pulse.  Decreased ROM to right shoulder due to pain.  Neurological: He is alert and oriented to person, place, and time.  Skin: Skin is warm and dry.  Psychiatric: He has a normal mood and affect. His behavior is normal.    ED Course  Procedures (including critical care time)  DIAGNOSTIC STUDIES: Oxygen Saturation is 100% on room  air, normal by my interpretation.    COORDINATION OF CARE: 8:19 PM-Informed pt that symptoms are most likely due to arthritis.  Discussed treatment plan which includes anti-inflammatories, pain medication and f/u with orthopedics with pt at bedside and pt agreed to plan.    Dg Shoulder Right  08/16/2012   *RADIOLOGY REPORT*  Clinical Data: Pain without trauma.  RIGHT SHOULDER - 2+ VIEW  Comparison: None.  Findings: Mild degenerative regularity of the undersurface of the distal clavicle.  Visualized portion of the right hemithorax is normal.  IMPRESSION: Mild acromioclavicular joint osteoarthritis. No acute findings.   Original Report Authenticated By: Jeronimo Greaves, M.D.     No diagnosis found.  MDM  Acute onset of right shoulder pain. No known injury.  Radiology results reveal mild osteoarthritis.  Increased pain with abduction beyond 90 degrees.  Suspect rotator cuff involvement.  Sling, anti-inflammatory, ortho follow-up.    I personally performed the services described in this documentation, which was scribed in my presence. The recorded information has been reviewed and is accurate.    Jimmye Norman, NP 08/16/12 2325

## 2012-08-16 NOTE — ED Notes (Signed)
Pt states he started having right shoulder pain last pm. No injury noted. States he mowed the yard with a Firefighter yesterday. States he is unable to move arm unless he picks arm up with left hand. States 'from the elbow down I'm fine'. Strong regular right radial pulse noted. Good cap refill.

## 2012-08-19 NOTE — ED Provider Notes (Signed)
Medical screening examination/treatment/procedure(s) were performed by non-physician practitioner and as supervising physician I was immediately available for consultation/collaboration.   Lyanne Co, MD 08/19/12 1440

## 2012-08-26 ENCOUNTER — Emergency Department (HOSPITAL_COMMUNITY)
Admission: EM | Admit: 2012-08-26 | Discharge: 2012-08-26 | Disposition: A | Discharge status: Home / Self Care | Payer: Medicare Other | Attending: Emergency Medicine | Admitting: Emergency Medicine

## 2012-08-26 ENCOUNTER — Encounter (HOSPITAL_COMMUNITY): Payer: Self-pay | Admitting: *Deleted

## 2012-08-26 DIAGNOSIS — H5710 Ocular pain, unspecified eye: Secondary | ICD-10-CM | POA: Insufficient documentation

## 2012-08-26 DIAGNOSIS — Y9289 Other specified places as the place of occurrence of the external cause: Secondary | ICD-10-CM | POA: Insufficient documentation

## 2012-08-26 DIAGNOSIS — Z880 Allergy status to penicillin: Secondary | ICD-10-CM | POA: Insufficient documentation

## 2012-08-26 DIAGNOSIS — G8929 Other chronic pain: Secondary | ICD-10-CM | POA: Insufficient documentation

## 2012-08-26 DIAGNOSIS — Z79899 Other long term (current) drug therapy: Secondary | ICD-10-CM | POA: Insufficient documentation

## 2012-08-26 DIAGNOSIS — H53149 Visual discomfort, unspecified: Secondary | ICD-10-CM | POA: Insufficient documentation

## 2012-08-26 DIAGNOSIS — T1590XA Foreign body on external eye, part unspecified, unspecified eye, initial encounter: Secondary | ICD-10-CM | POA: Insufficient documentation

## 2012-08-26 DIAGNOSIS — H5789 Other specified disorders of eye and adnexa: Secondary | ICD-10-CM | POA: Insufficient documentation

## 2012-08-26 DIAGNOSIS — J3489 Other specified disorders of nose and nasal sinuses: Secondary | ICD-10-CM | POA: Insufficient documentation

## 2012-08-26 DIAGNOSIS — T1592XA Foreign body on external eye, part unspecified, left eye, initial encounter: Secondary | ICD-10-CM

## 2012-08-26 DIAGNOSIS — H5440 Blindness, one eye, unspecified eye: Secondary | ICD-10-CM | POA: Insufficient documentation

## 2012-08-26 DIAGNOSIS — Y9389 Activity, other specified: Secondary | ICD-10-CM | POA: Insufficient documentation

## 2012-08-26 DIAGNOSIS — IMO0002 Reserved for concepts with insufficient information to code with codable children: Secondary | ICD-10-CM | POA: Insufficient documentation

## 2012-08-26 DIAGNOSIS — F17200 Nicotine dependence, unspecified, uncomplicated: Secondary | ICD-10-CM | POA: Insufficient documentation

## 2012-08-26 DIAGNOSIS — H538 Other visual disturbances: Secondary | ICD-10-CM | POA: Insufficient documentation

## 2012-08-26 DIAGNOSIS — M549 Dorsalgia, unspecified: Secondary | ICD-10-CM | POA: Insufficient documentation

## 2012-08-26 MED ORDER — TETRACAINE HCL 0.5 % OP SOLN
2.0000 [drp] | Freq: Once | OPHTHALMIC | Status: AC
  Administered 2012-08-26: 2 [drp] via OPHTHALMIC
  Filled 2012-08-26: qty 2

## 2012-08-26 MED ORDER — FLUORESCEIN SODIUM 1 MG OP STRP
1.0000 | ORAL_STRIP | Freq: Once | OPHTHALMIC | Status: AC
  Administered 2012-08-26: 1 via OPHTHALMIC
  Filled 2012-08-26: qty 1

## 2012-08-26 MED ORDER — OXYCODONE-ACETAMINOPHEN 5-325 MG PO TABS: 1.0000 | ORAL_TABLET | ORAL | Status: DC | PRN

## 2012-08-26 MED ORDER — ERYTHROMYCIN 5 MG/GM OP OINT: TOPICAL_OINTMENT | OPHTHALMIC | Status: DC

## 2012-08-26 NOTE — ED Provider Notes (Signed)
CSN: [id number]     Arrival date & time 08/26/12  0919 History     First MD Initiated Contact with Patient 08/26/12 [phone]     Chief Complaint  Patient presents with  . Eye Problem   (Consider location/radiation/quality/duration/timing/severity/associated sxs/prior Treatment) HPI Comments: Patient presents with left eye pain.  States he began feeling like he had something in his eye yesterday afternoon, rated 3/10 pain.  Today wore up with 8/10 pain and blurry vision in the eye.   reports he was grinding metal two days ago but had no symptoms.  No fevers, no URI symptoms.   Patient is a 47 y.o. male presenting with eye problem. The history is provided by the patient.  Eye Problem Associated symptoms: discharge (clear), photophobia and redness   Associated symptoms: no itching     Past Medical History  Diagnosis Date  . No significant past medical history   . Disc degeneration   . Disc degeneration   . Blind right eye   . Chronic back pain   . Degenerative disc disease    Past Surgical History  Procedure Laterality Date  . Degenerative cervical disc     No family history on file. History  Substance Use Topics  . Smoking status: Current Every Day Smoker -- 0.50 packs/day    Types: Cigarettes  . Smokeless tobacco: Not on file  . Alcohol Use: No    Review of Systems  Constitutional: Negative for fever and chills.  HENT: Positive for congestion.   Eyes: Positive for photophobia, pain, discharge (clear), redness and visual disturbance. Negative for itching.    Allergies  Chicken allergy and Penicillins  Home Medications   Current Outpatient Rx  Name  Route  Sig  Dispense  Refill  . ibuprofen (ADVIL,MOTRIN) 200 MG tablet   Oral   Take 200 mg by mouth every 6 (six) hours as needed for pain.         . methocarbamol (ROBAXIN) 750 MG tablet   Oral   Take 750 mg by mouth daily as needed (muscle spasm). Muscle pain         . naproxen (NAPROSYN) 500 MG tablet    Oral   Take 500 mg by mouth 2 (two) times daily.         Marland Kitchen oxyCODONE-acetaminophen (PERCOCET) 7.5-325 MG per tablet   Oral   Take 1 tablet by mouth every 6 (six) hours as needed for pain.   15 tablet   0    BP 137/80  Pulse 69  Temp(Src) 97.9 F (36.6 C) (Oral)  Resp 18  SpO2 99% Physical Exam  Nursing note and vitals reviewed. Constitutional: He appears well-developed and well-nourished. No distress.  HENT:  Head: Normocephalic and atraumatic.  Eyes: EOM are normal. Left eye exhibits no discharge. Foreign body present in the left eye.    Neck: Neck supple.  Pulmonary/Chest: Effort normal.  Neurological: He is alert.  Skin: He is not diaphoretic.    ED Course   FOREIGN BODY REMOVAL Date/Time: 08/26/2012 10:33 AM Performed by: Trixie Dredge Authorized by: Trixie Dredge Consent: Verbal consent obtained. Consent given by: patient Patient understanding: patient states understanding of the procedure being performed Patient identity confirmed: verbally with patient Body area: eye Location details: left conjunctiva Anesthesia: see MAR for details Local anesthetic: tetracaine drops Patient sedated: no Patient restrained: no Patient cooperative: yes Localization method: slit lamp and visualized Removal mechanism: moist cotton swab Eye examined with fluorescein. No fluorescein uptake. Dressing: antibiotic  ointment and eye patch 0 objects recovered. Post-procedure assessment: foreign body not removed Patient tolerance: Patient tolerated the procedure well with no immediate complications.   (including critical care time)  Labs Reviewed - No data to display No results found. 1. Eye foreign body, left, initial encounter     MDM  Pt with two small FB in left eye.  Do not appear to be metal.  I was unable to remove the FB here.  Pt to follow up with ophthalmology tomorrow for removal.  (today is Sunday).  Percocet and erythromycin ointment prescribed.  Eye patch provided  for comfort.  PT advised not to rub or push on eye. Discussed If you develop redness, swelling, pus draining from the wound, or fevers greater than 100.4, return to the ER immediately for a recheck.   with patient.  Pt given return precautions.  Pt verbalizes understanding and agrees with plan.     Trixie Dredge, PA-C 08/26/12 1535

## 2012-08-26 NOTE — Discharge Instructions (Signed)
Read the information below.  Use the prescribed medication as directed.  Please discuss all new medications with your pharmacist.  Do not take additional tylenol while taking the prescribed pain medication to avoid overdose.  You may return to the Emergency Department at any time for worsening condition or any new symptoms that concern you.   Call the eye doctor first thing tomorrow morning to schedule an appointment for foreign body removal from your eye.  If you develop fevers, thick discharge from your eye, uncontrolled pain, or change in vision, see the eye doctor or return to the ER immediately for a recheck.    Conjunctival Foreign Body A small foreign body was removed from your eye. At this time there does not appear to be damage to your eye. Specks of metal, sand, or wood commonly cause this injury. It often occurs during windy weather and when working with power tools. Sometimes a medication like Novocain (a local anesthetic), is used to remove a foreign body. This local anesthetic is a medication that makes the tissues around the eye numb. Your eye may be uncomfortable when the local anesthetic wears off. This is especially true if the cornea was scratched. The cornea is the very sensitive clear membrane over the front of the eye. Blinking the eye may increase the pain. Sometimes a patch is applied for comfort. The more you rest your "good eye", the better both eyes will feel. HOME CARE INSTRUCTIONS  The use of eye patches varies from state to state and from caregiver to caregiver. If eye patch was applied:  Keep your eye patch on for as long as directed by your caregiver until your follow-up appointment.  Do NOT remove the patch unless instructed to do so to put in medications; replace patch and re-tape it as it was before. Follow the same procedure if the patch becomes loose.  WARNING: Do not drive or operate machinery while your eye is patched. Your ability to judge distances is  impaired. If no eye patch was applied:  Keep your eye closed as much as possible if there is discomfort.  Do not rub your eye.  Wear dark glasses for as long as directed by your caregiver to protect your eyes from bright light.  Do not wear contact lenses until instructed to do so.  Wear protective eye covering if your job or hobby involves the risk of eye injury. This is especially important when working with high speed tools.  Only take over-the-counter or prescription medicines for pain, discomfort, or fever as directed by your caregiver.  It is important for you to monitor your return to good health. Look at your eye periodically to determine if there is any change in your condition. SEEK IMMEDIATE MEDICAL CARE IF:   Pain increases in your eye or your vision changes.  You have problems with your eye patch.  The injury to your eye seems to be getting worse. In particular if the area around the site of the foreign body is changing color or seems to be getting larger.  You develop any kind of discharge from the injured eye, or there is fluid leaking from the eye.  Swelling and/or soreness (inflammation) develops around the affected eye.  An oral temperature above 102 F (38.9 C) develops. MAKE SURE YOU:   Understand these instructions.  Will watch your condition.  Will get help right away if you are not doing well or get worse. Document Released: 12/02/2005 Document Revised: 03/14/2011 Document Reviewed: 12/05/2006  [employer]® Patient Information ©2014 [employer]. ° °

## 2012-08-26 NOTE — ED Notes (Signed)
Pt is here with left eye irritation and feels like something is in it.  Pt was sanding something on Friday.

## 2012-08-27 NOTE — ED Provider Notes (Signed)
History/physical exam/procedure(s) were performed by non-physician practitioner and as supervising physician I was immediately available for consultation/collaboration. I have reviewed all notes and am in agreement with care and plan.   Lorain Fettes S Brenlyn Beshara, MD 08/27/12 1203 

## 2012-10-19 ENCOUNTER — Encounter (HOSPITAL_COMMUNITY): Payer: Self-pay | Admitting: Emergency Medicine

## 2012-10-19 ENCOUNTER — Emergency Department (HOSPITAL_COMMUNITY)
Admission: EM | Admit: 2012-10-19 | Discharge: 2012-10-19 | Disposition: A | Discharge status: Home / Self Care | Payer: Medicare Other | Attending: Emergency Medicine | Admitting: Emergency Medicine

## 2012-10-19 DIAGNOSIS — S139XXA Sprain of joints and ligaments of unspecified parts of neck, initial encounter: Secondary | ICD-10-CM | POA: Insufficient documentation

## 2012-10-19 DIAGNOSIS — Y929 Unspecified place or not applicable: Secondary | ICD-10-CM | POA: Insufficient documentation

## 2012-10-19 DIAGNOSIS — F17200 Nicotine dependence, unspecified, uncomplicated: Secondary | ICD-10-CM | POA: Insufficient documentation

## 2012-10-19 DIAGNOSIS — Z880 Allergy status to penicillin: Secondary | ICD-10-CM | POA: Insufficient documentation

## 2012-10-19 DIAGNOSIS — X58XXXA Exposure to other specified factors, initial encounter: Secondary | ICD-10-CM | POA: Insufficient documentation

## 2012-10-19 DIAGNOSIS — Y939 Activity, unspecified: Secondary | ICD-10-CM | POA: Insufficient documentation

## 2012-10-19 DIAGNOSIS — H5440 Blindness, one eye, unspecified eye: Secondary | ICD-10-CM | POA: Insufficient documentation

## 2012-10-19 DIAGNOSIS — S161XXA Strain of muscle, fascia and tendon at neck level, initial encounter: Secondary | ICD-10-CM

## 2012-10-19 DIAGNOSIS — S79919A Unspecified injury of unspecified hip, initial encounter: Secondary | ICD-10-CM | POA: Insufficient documentation

## 2012-10-19 DIAGNOSIS — Z79899 Other long term (current) drug therapy: Secondary | ICD-10-CM | POA: Insufficient documentation

## 2012-10-19 MED ORDER — CYCLOBENZAPRINE HCL 10 MG PO TABS: 10.0000 mg | ORAL_TABLET | Freq: Two times a day (BID) | ORAL | Status: DC | PRN

## 2012-10-19 MED ORDER — HYDROCODONE-ACETAMINOPHEN 5-325 MG PO TABS: 1.0000 | ORAL_TABLET | ORAL | Status: DC | PRN

## 2012-10-19 NOTE — ED Provider Notes (Signed)
CSN: [id number]     Arrival date & time 10/19/12  1810 History   This chart was scribed for non-physician practitioner Teressa Lower, NP, working with Lyanne Co, MD by Ronal Fear, ED scribe. This patient was seen in room TR05C/TR05C and the patient's care was started at 6:17 PM.   Chief Complaint  Patient presents with  . Neck Pain   The history is provided by the patient. No language interpreter was used.   Pt complaining of sudden onset neck pain 2x days ago that woke him from sleep. Pt states that the pain is so bad that he cannot turn his neck.  He also complains of right hip pain and bilateral tingling in his hands. Pt took some muscle relaxer with little relief and he also iced and heated the affected area with no relief. Pt has a hx of degenerative disc disease. Past Medical History  Diagnosis Date  . No significant past medical history   . Disc degeneration   . Disc degeneration   . Blind right eye   . Chronic back pain   . Degenerative disc disease    Past Surgical History  Procedure Laterality Date  . Degenerative cervical disc     History reviewed. No pertinent family history. History  Substance Use Topics  . Smoking status: Current Every Day Smoker -- 0.50 packs/day    Types: Cigarettes  . Smokeless tobacco: Not on file  . Alcohol Use: No    Review of Systems  Musculoskeletal: Positive for myalgias and neck pain.  Neurological: Positive for numbness.  All other systems reviewed and are negative.    Allergies  Chicken allergy and Penicillins  Home Medications   Current Outpatient Rx  Name  Route  Sig  Dispense  Refill  . erythromycin ophthalmic ointment      Place a 1/2 inch ribbon of ointment into the lower eyelid. Left eye.   3.5 g   0   . ibuprofen (ADVIL,MOTRIN) 200 MG tablet   Oral   Take 200 mg by mouth every 6 (six) hours as needed for pain.         . methocarbamol (ROBAXIN) 750 MG tablet   Oral   Take 750 mg by mouth daily as  needed (muscle spasm). Muscle pain         . naproxen (NAPROSYN) 500 MG tablet   Oral   Take 500 mg by mouth 2 (two) times daily.         Marland Kitchen oxyCODONE-acetaminophen (PERCOCET) 5-325 MG per tablet   Oral   Take 1-2 tablets by mouth every 4 (four) hours as needed for pain.   20 tablet   0   . oxyCODONE-acetaminophen (PERCOCET) 7.5-325 MG per tablet   Oral   Take 1 tablet by mouth every 6 (six) hours as needed for pain.   15 tablet   0    BP 141/80  Pulse 81  Temp(Src) 97.8 F (36.6 C) (Oral)  Resp 18  Wt 178 lb (80.74 kg)  BMI 24.14 kg/m2  SpO2 99% Physical Exam  Nursing note and vitals reviewed. Constitutional: He is oriented to person, place, and time. He appears well-developed and well-nourished. No distress.  HENT:  Head: Normocephalic and atraumatic.  Eyes: EOM are normal.  Neck: Neck supple. Muscular tenderness present. No tracheal deviation and normal range of motion present.  Pt unable to turn neck to the left  Cardiovascular: Normal rate.   Pulmonary/Chest: Effort normal. No respiratory distress.  Musculoskeletal: He exhibits tenderness.  Neurological: He is alert and oriented to person, place, and time.  Skin: Skin is warm and dry.  Psychiatric: He has a normal mood and affect. His behavior is normal.    ED Course  Procedures (including critical care time) DIAGNOSTIC STUDIES: Oxygen Saturation is 99% on RA, normal by my interpretation.    COORDINATION OF CARE:  6:22 PM- Pt advised of plan for treatment including muscle relaxer and anti inflammatory and pt agrees.   Labs Review Labs Reviewed - No data to display Imaging Review No results found.  EKG Interpretation   None       MDM   1. Cervical strain, initial encounter    Pt is not having any neuro deficits:don't think pt needs imaging at this time:pt is okay to follow up with Dr. Pearletha Forge for continued symptoms  I personally performed the services described in this documentation, which  was scribed in my presence. The recorded information has been reviewed and is accurate.    Teressa Lower, NP 10/19/12 1900

## 2012-10-19 NOTE — ED Notes (Signed)
Pt reports right sided neck pain that started on Wednesday. Reports that he took a flexaril yesterday without much relief. States the pain is worse when he moves his head around. Denies any heavy lifting

## 2012-10-20 NOTE — ED Provider Notes (Signed)
Medical screening examination/treatment/procedure(s) were performed by non-physician practitioner and as supervising physician I was immediately available for consultation/collaboration.  Lyanne Co, MD 10/20/12 [phone]

## 2012-10-21 ENCOUNTER — Encounter (HOSPITAL_COMMUNITY): Payer: Self-pay | Admitting: Emergency Medicine

## 2012-10-21 ENCOUNTER — Emergency Department (HOSPITAL_COMMUNITY): Payer: Medicare Other

## 2012-10-21 ENCOUNTER — Emergency Department (HOSPITAL_COMMUNITY)
Admission: EM | Admit: 2012-10-21 | Discharge: 2012-10-21 | Disposition: A | Discharge status: Home / Self Care | Payer: Medicare Other | Attending: Emergency Medicine | Admitting: Emergency Medicine

## 2012-10-21 DIAGNOSIS — Z79899 Other long term (current) drug therapy: Secondary | ICD-10-CM | POA: Insufficient documentation

## 2012-10-21 DIAGNOSIS — M5412 Radiculopathy, cervical region: Secondary | ICD-10-CM | POA: Insufficient documentation

## 2012-10-21 DIAGNOSIS — H5440 Blindness, one eye, unspecified eye: Secondary | ICD-10-CM | POA: Insufficient documentation

## 2012-10-21 DIAGNOSIS — G8929 Other chronic pain: Secondary | ICD-10-CM | POA: Insufficient documentation

## 2012-10-21 DIAGNOSIS — Z880 Allergy status to penicillin: Secondary | ICD-10-CM | POA: Insufficient documentation

## 2012-10-21 DIAGNOSIS — F17200 Nicotine dependence, unspecified, uncomplicated: Secondary | ICD-10-CM | POA: Insufficient documentation

## 2012-10-21 DIAGNOSIS — M549 Dorsalgia, unspecified: Secondary | ICD-10-CM | POA: Insufficient documentation

## 2012-10-21 DIAGNOSIS — D34 Benign neoplasm of thyroid gland: Secondary | ICD-10-CM | POA: Insufficient documentation

## 2012-10-21 DIAGNOSIS — M25519 Pain in unspecified shoulder: Secondary | ICD-10-CM | POA: Insufficient documentation

## 2012-10-21 DIAGNOSIS — R209 Unspecified disturbances of skin sensation: Secondary | ICD-10-CM | POA: Insufficient documentation

## 2012-10-21 MED ORDER — HYDROCODONE-ACETAMINOPHEN 5-325 MG PO TABS: 1.0000 | ORAL_TABLET | Freq: Four times a day (QID) | ORAL | Status: DC | PRN

## 2012-10-21 MED ORDER — DIAZEPAM 5 MG PO TABS: 5.0000 mg | ORAL_TABLET | Freq: Two times a day (BID) | ORAL | Status: DC

## 2012-10-21 NOTE — ED Notes (Signed)
Patient transported to X-ray 

## 2012-10-21 NOTE — ED Notes (Signed)
Patient returned from X-ray 

## 2012-10-21 NOTE — ED Notes (Signed)
Here Friday with pain in right side of neck; not improving. Now hurts worse on left side and is radiating down between shoulder blades. Cannot sleep due to the pain. Feels numbness in both hands and in hips. Flexeril and Vicodin prescribed on Friday, neither are working. No injury that he knows of, just woke up Wednesday with the neck pain.

## 2012-10-21 NOTE — ED Provider Notes (Signed)
CSN: [id number]     Arrival date & time 10/21/12  1709 History  This chart was scribed for non-physician practitioner Magnus Sinning, PA-C, working with Rolan Bucco, MD by Dorothey Baseman, ED Scribe. This patient was seen in room TR05C/TR05C and the patient's care was started at 6: PM.    Chief Complaint  Patient presents with  . Muscle Pain    re-eval muscle pain   The history is provided by the patient. No language interpreter was used.   HPI Comments: Timothy Boone is a 47 y.o. male with a history of degenerative disc disease and chronic back pain who presents to the Emergency Department complaining of neck pain that radiates down the spine onset 2-3 days ago that has been progressively worsening. Patient denies any acute injury or trauma.  Patient reports that the pain began near the right shoulder then moved towards the left. Patient reports that he was seen 5 years ago at [facility] and was diagnosed with degenerative disc disease, but states that this pain feels different. Patient numbness to the bilateral hands, which has been present for months. He states that he took Flexeril at home without relief.  Hydrocodone does provide temporary relief of the pain.  Patient denies fever or chills.   Past Medical History  Diagnosis Date  . No significant past medical history   . Disc degeneration   . Disc degeneration   . Blind right eye   . Chronic back pain   . Degenerative disc disease    Past Surgical History  Procedure Laterality Date  . Degenerative cervical disc     History reviewed. No pertinent family history. History  Substance Use Topics  . Smoking status: Current Every Day Smoker -- 0.50 packs/day    Types: Cigarettes  . Smokeless tobacco: Not on file  . Alcohol Use: No    Review of Systems  A complete 10 system review of systems was obtained and all systems are negative except as noted in the HPI and PMH.   Allergies  Chicken allergy and Penicillins  Home  Medications   Current Outpatient Rx  Name  Route  Sig  Dispense  Refill  . cyclobenzaprine (FLEXERIL) 10 MG tablet   Oral   Take 1 tablet (10 mg total) by mouth 2 (two) times daily as needed for muscle spasms.   20 tablet   0   . HYDROcodone-acetaminophen (NORCO/VICODIN) 5-325 MG per tablet   Oral   Take 1-2 tablets by mouth every 4 (four) hours as needed for pain.   15 tablet   0   . methocarbamol (ROBAXIN) 750 MG tablet   Oral   Take 750 mg by mouth daily as needed (muscle spasm). Muscle pain          Triage Vitals: BP 132/76  Pulse 82  Temp(Src) 97.8 F (36.6 C) (Oral)  Resp 20  SpO2 98%  Physical Exam  Nursing note and vitals reviewed. Constitutional: He is oriented to person, place, and time. He appears well-developed and well-nourished. No distress.  HENT:  Head: Normocephalic and atraumatic.  Eyes: Conjunctivae are normal.  Neck: Normal range of motion. Neck supple.  Pulmonary/Chest: Effort normal. No respiratory distress.  Abdominal: He exhibits no distension.  Musculoskeletal: Normal range of motion. He exhibits tenderness.  Tenderness to palpation to the C and T spine. No step offs or deformities. 2+ radial pulses.   Neurological: He is alert and oriented to person, place, and time.  Distal sensation of  all fingers intact bilaterally.   Skin: Skin is warm and dry.  Psychiatric: He has a normal mood and affect. His behavior is normal.    ED Course  Procedures (including critical care time)  DIAGNOSTIC STUDIES: Oxygen Saturation is 98% on room air, normal by my interpretation.    COORDINATION OF CARE: 6:30 PM- Will order an x-ray of the C spine. Discussed treatment plan with patient at bedside and patient verbalized agreement.   7:30 PM- Discussed that x-ray results indicate degenerative changes. Advised patient to follow up with the referred neurosurgeon, especially if there are any new or worsening symptoms. Will discharge patient with Valium and  Norco. Discussed treatment plan with patient at bedside and patient verbalized agreement.    Labs Review Labs Reviewed - No data to display  Imaging Review Dg Cervical Spine Complete  10/21/2012   CLINICAL DATA:  Left and posterior neck pain. No known injury.  EXAM: CERVICAL SPINE  4+ VIEWS  COMPARISON:  03/28/2012  FINDINGS: Degenerative disc disease changes at C5-6 with disc space narrowing and spurring. Mild right neural foraminal narrowing at this level. Normal alignment. No fracture. Prevertebral soft tissues are normal.  IMPRESSION: Degenerative changes at C5-6 as above. No acute findings.   Electronically Signed   By: Charlett Nose M.D.   On: 10/21/2012 19:25    EKG Interpretation   None       MDM  No diagnosis found. Patient presenting with pain of his cervical spine.  No acute injury or trauma.  Xray showing degenerative changes.  Symptoms consistent with Cervical Radiculopathy.  Patient neurovascularly intact.  Patient discharged home with pain medication and given referral to Neurosurgery.    I personally performed the services described in this documentation, which was scribed in my presence. The recorded information has been reviewed and is accurate.     Pascal Lux [place], PA-C 10/22/12 0000

## 2012-10-22 NOTE — ED Provider Notes (Signed)
Medical screening examination/treatment/procedure(s) were performed by non-physician practitioner and as supervising physician I was immediately available for consultation/collaboration.   Rolan Bucco, MD 10/22/12 [phone]

## 2012-10-23 ENCOUNTER — Encounter (HOSPITAL_COMMUNITY): Payer: Self-pay | Admitting: Emergency Medicine

## 2012-10-23 DIAGNOSIS — Z880 Allergy status to penicillin: Secondary | ICD-10-CM | POA: Insufficient documentation

## 2012-10-23 DIAGNOSIS — Z79899 Other long term (current) drug therapy: Secondary | ICD-10-CM | POA: Insufficient documentation

## 2012-10-23 DIAGNOSIS — IMO0002 Reserved for concepts with insufficient information to code with codable children: Secondary | ICD-10-CM | POA: Insufficient documentation

## 2012-10-23 DIAGNOSIS — H5440 Blindness, one eye, unspecified eye: Secondary | ICD-10-CM | POA: Insufficient documentation

## 2012-10-23 DIAGNOSIS — S139XXA Sprain of joints and ligaments of unspecified parts of neck, initial encounter: Secondary | ICD-10-CM | POA: Insufficient documentation

## 2012-10-23 DIAGNOSIS — Y939 Activity, unspecified: Secondary | ICD-10-CM | POA: Insufficient documentation

## 2012-10-23 DIAGNOSIS — IMO0001 Reserved for inherently not codable concepts without codable children: Secondary | ICD-10-CM | POA: Insufficient documentation

## 2012-10-23 DIAGNOSIS — X58XXXA Exposure to other specified factors, initial encounter: Secondary | ICD-10-CM | POA: Insufficient documentation

## 2012-10-23 DIAGNOSIS — M549 Dorsalgia, unspecified: Secondary | ICD-10-CM | POA: Insufficient documentation

## 2012-10-23 DIAGNOSIS — Y929 Unspecified place or not applicable: Secondary | ICD-10-CM | POA: Insufficient documentation

## 2012-10-23 DIAGNOSIS — G8929 Other chronic pain: Secondary | ICD-10-CM | POA: Insufficient documentation

## 2012-10-23 DIAGNOSIS — M25519 Pain in unspecified shoulder: Secondary | ICD-10-CM | POA: Insufficient documentation

## 2012-10-23 DIAGNOSIS — F17200 Nicotine dependence, unspecified, uncomplicated: Secondary | ICD-10-CM | POA: Insufficient documentation

## 2012-10-23 DIAGNOSIS — M5412 Radiculopathy, cervical region: Secondary | ICD-10-CM | POA: Insufficient documentation

## 2012-10-23 MED ORDER — OXYCODONE-ACETAMINOPHEN 5-325 MG PO TABS
1.0000 | ORAL_TABLET | Freq: Once | ORAL | Status: AC
  Administered 2012-10-23: 1 via ORAL
  Filled 2012-10-23: qty 1

## 2012-10-23 NOTE — ED Notes (Signed)
Here with neck pain, referred to Dr. Glee Arvin, unable to get in touch with them. Seen here multiple times and given pain medication. Pain medication is not helping at home. Pain is rated 10/10. Family is upset with care he has received. They would him to see a neurologist here tonight and not a PA and physician here in the ER.  Pt reports he is unable to move neck. Pain is described as stabbing and throbbing, CMS intact.

## 2012-10-24 ENCOUNTER — Emergency Department (HOSPITAL_COMMUNITY)
Admission: EM | Admit: 2012-10-24 | Discharge: 2012-10-24 | Disposition: A | Discharge status: Home / Self Care | Payer: Medicare Other | Attending: Emergency Medicine | Admitting: Emergency Medicine

## 2012-10-24 DIAGNOSIS — M5412 Radiculopathy, cervical region: Secondary | ICD-10-CM

## 2012-10-24 DIAGNOSIS — S161XXD Strain of muscle, fascia and tendon at neck level, subsequent encounter: Secondary | ICD-10-CM

## 2012-10-24 MED ORDER — MORPHINE SULFATE 4 MG/ML IJ SOLN
4.0000 mg | Freq: Once | INTRAMUSCULAR | Status: AC
  Administered 2012-10-24: 4 mg via INTRAMUSCULAR
  Filled 2012-10-24: qty 1

## 2012-10-24 MED ORDER — KETOROLAC TROMETHAMINE 60 MG/2ML IM SOLN
60.0000 mg | Freq: Once | INTRAMUSCULAR | Status: AC
  Administered 2012-10-24: 60 mg via INTRAMUSCULAR
  Filled 2012-10-24: qty 2

## 2012-10-24 MED ORDER — IBUPROFEN 800 MG PO TABS: 800.0000 mg | ORAL_TABLET | Freq: Three times a day (TID) | ORAL | Status: DC

## 2012-10-24 NOTE — ED Notes (Signed)
MD at bedside. 

## 2012-10-24 NOTE — ED Provider Notes (Signed)
CSN: [id number]     Arrival date & time 10/23/12  1907 History   First MD Initiated Contact with Patient 10/24/12 0020     Chief Complaint  Patient presents with  . 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 The patient describes the pain as tightness. He has been seen in emergency department several times for this. He is to see a neurosurgeon without success. He occasionally has shooting pains down his left arm but this is not his primary concern. He is having no numbness or weakness. He states he woke with the pain in his neck. Denies any trauma. He is taking muscle relaxants and hydrocodone with minimal relief. Past Medical History  Diagnosis Date  . No significant past medical history   . Disc degeneration   . Disc degeneration   . Blind right eye   . Chronic back pain   . Degenerative disc disease    Past Surgical History  Procedure Laterality Date  . Degenerative cervical disc     History reviewed. No pertinent family history. History  Substance Use Topics  . Smoking status: Current Every Day Smoker -- 0.50 packs/day    Types: Cigarettes  . Smokeless tobacco: Not on file  . Alcohol Use: No    Review of Systems  Constitutional: Negative for fever and chills.  Respiratory: Negative for shortness of breath.   Cardiovascular: Negative for chest pain.  Gastrointestinal: Negative for nausea, vomiting and abdominal pain.  Musculoskeletal: Positive for myalgias and neck pain. Negative for back pain and neck stiffness.  Skin: Negative for rash and wound.  Neurological: Negative for dizziness, weakness, light-headedness, numbness and headaches.  All other systems reviewed and are negative.    Allergies  Chicken allergy and Penicillins  Home Medications   Current Outpatient Rx  Name  Route  Sig   Dispense  Refill  . diazepam (VALIUM) 5 MG tablet   Oral   Take 1 tablet (5 mg total) by mouth 2 (two) times daily.   10 tablet   0   . HYDROcodone-acetaminophen (NORCO/VICODIN) 5-325 MG per tablet   Oral   Take 1-2 tablets by mouth every 4 (four) hours as needed for pain.   15 tablet   0   . methocarbamol (ROBAXIN) 750 MG tablet   Oral   Take 750 mg by mouth daily as needed (muscle spasm). Muscle pain         . cyclobenzaprine (FLEXERIL) 10 MG tablet   Oral   Take 1 tablet (10 mg total) by mouth 2 (two) times daily as needed for muscle spasms.   20 tablet   0   . ibuprofen (ADVIL,MOTRIN) 800 MG tablet   Oral   Take 1 tablet (800 mg total) by mouth 3 (three) times daily with meals.   30 tablet   0    BP 121/88  Pulse 80  Temp(Src) 97.1 F (36.2 C) (Oral)  Resp 19  SpO2 99% Physical Exam  Nursing note and vitals reviewed. Constitutional: He is oriented to person, place, and time. He appears well-developed and well-nourished. No distress.  HENT:  Head: Normocephalic and atraumatic.  Mouth/Throat: Oropharynx is clear and moist.  Eyes: EOM are normal. Pupils are equal, round, and reactive to light.  Neck: Neck supple.  Tenderness to palpation over the left paraspinal cervical muscles and left trapezius muscle. Patient has no midline tenderness  to palpation. There no masses appreciated.  Cardiovascular: Normal rate and regular rhythm.   Pulmonary/Chest: Effort normal and breath sounds normal. No respiratory distress. He has no wheezes. He has no rales.  Abdominal: Soft. Bowel sounds are normal.  Musculoskeletal: Normal range of motion. He exhibits no edema and no tenderness.  2+ radial pulses bilaterally.  Neurological: He is alert and oriented to person, place, and time.  Patient is alert and oriented x3 with clear, goal oriented speech. Patient has 5/5 motor in all extremities. Sensation is intact to light touch.   Skin: Skin is warm and dry. No rash noted. No  erythema.  Psychiatric: He has a normal mood and affect. His behavior is normal.    ED Course  Procedures (including critical care time) Labs Review Labs Reviewed - No data to display Imaging Review No results found.  EKG Interpretation   None       MDM   1. Cervical strain, acute, subsequent encounter   2. Cervical radiculopathy    Patient likely has cervical strain is complicated by underlying cervical degenerative disease. I spent an extensive amount of time discussing treatment options. We'll add NSAID to medication regimen. Return precautions have been given. No emergent findings at present.    [name], MD 10/24/12 [phone]

## 2012-10-24 NOTE — ED Notes (Signed)
Patient has ride home.

## 2013-01-18 ENCOUNTER — Emergency Department (HOSPITAL_COMMUNITY): Payer: Self-pay

## 2013-01-18 ENCOUNTER — Emergency Department (HOSPITAL_COMMUNITY)
Admission: EM | Admit: 2013-01-18 | Discharge: 2013-01-18 | Disposition: A | Discharge status: Home / Self Care | Payer: Self-pay | Attending: Emergency Medicine | Admitting: Emergency Medicine

## 2013-01-18 ENCOUNTER — Encounter (HOSPITAL_COMMUNITY): Payer: Self-pay | Admitting: Emergency Medicine

## 2013-01-18 DIAGNOSIS — H5440 Blindness, one eye, unspecified eye: Secondary | ICD-10-CM | POA: Insufficient documentation

## 2013-01-18 DIAGNOSIS — F17200 Nicotine dependence, unspecified, uncomplicated: Secondary | ICD-10-CM | POA: Insufficient documentation

## 2013-01-18 DIAGNOSIS — Z87442 Personal history of urinary calculi: Secondary | ICD-10-CM | POA: Insufficient documentation

## 2013-01-18 DIAGNOSIS — Z880 Allergy status to penicillin: Secondary | ICD-10-CM | POA: Insufficient documentation

## 2013-01-18 DIAGNOSIS — G8929 Other chronic pain: Secondary | ICD-10-CM | POA: Insufficient documentation

## 2013-01-18 DIAGNOSIS — M549 Dorsalgia, unspecified: Secondary | ICD-10-CM | POA: Insufficient documentation

## 2013-01-18 LAB — BASIC METABOLIC PANEL
BUN: 15 mg/dL (ref 6–23)
CHLORIDE: 103 meq/L (ref 96–112)
CO2: 23 meq/L (ref 19–32)
Calcium: 9.3 mg/dL (ref 8.4–10.5)
Creatinine, Ser: 1.05 mg/dL (ref 0.50–1.35)
GFR calc Af Amer: >90 mL/min (ref >90)
GFR calc non Af Amer: 82 mL/min — ABNORMAL LOW (ref >90)
Glucose, Bld: 90 mg/dL (ref 70–99)
POTASSIUM: 4.4 meq/L (ref 3.7–5.3)
SODIUM: 139 meq/L (ref 137–147)

## 2013-01-18 LAB — POCT I-STAT, CHEM 8
BUN: 15 mg/dL (ref 6–23)
CALCIUM ION: 1.24 mmol/L — AB (ref 1.12–1.23)
CHLORIDE: 106 meq/L (ref 96–112)
Creatinine, Ser: 1.2 mg/dL (ref 0.50–1.35)
Glucose, Bld: 91 mg/dL (ref 70–99)
HEMATOCRIT: 46 % (ref 39.0–52.0)
Hemoglobin: 15.6 g/dL (ref 13.0–17.0)
POTASSIUM: 4.2 meq/L (ref 3.7–5.3)
SODIUM: 141 meq/L (ref 137–147)
TCO2: 24 mmol/L (ref 0–100)

## 2013-01-18 LAB — URINALYSIS, ROUTINE W REFLEX MICROSCOPIC
Bilirubin Urine: NEGATIVE
GLUCOSE, UA: NEGATIVE mg/dL
Hgb urine dipstick: NEGATIVE
KETONES UR: NEGATIVE mg/dL
LEUKOCYTES UA: NEGATIVE
NITRITE: NEGATIVE
PH: 6 (ref 5.0–8.0)
Protein, ur: NEGATIVE mg/dL
SPECIFIC GRAVITY, URINE: 1.028 (ref 1.005–1.030)
Urobilinogen, UA: 0.2 mg/dL (ref 0.0–1.0)

## 2013-01-18 MED ORDER — CYCLOBENZAPRINE HCL 10 MG PO TABS
10.0000 mg | ORAL_TABLET | Freq: Once | ORAL | Status: AC
  Administered 2013-01-18: 10 mg via ORAL
  Filled 2013-01-18: qty 1

## 2013-01-18 MED ORDER — HYDROMORPHONE HCL PF 1 MG/ML IJ SOLN
1.0000 mg | Freq: Once | INTRAMUSCULAR | Status: AC
  Administered 2013-01-18: 1 mg via INTRAMUSCULAR
  Filled 2013-01-18: qty 1

## 2013-01-18 MED ORDER — TRAMADOL HCL 50 MG PO TABS: 50.0000 mg | ORAL_TABLET | Freq: Four times a day (QID) | ORAL | Status: DC | PRN

## 2013-01-18 MED ORDER — ONDANSETRON 4 MG PO TBDP
4.0000 mg | ORAL_TABLET | Freq: Once | ORAL | Status: AC
  Administered 2013-01-18: 4 mg via ORAL
  Filled 2013-01-18: qty 1

## 2013-01-18 MED ORDER — IBUPROFEN 800 MG PO TABS
800.0000 mg | ORAL_TABLET | Freq: Once | ORAL | Status: AC
  Administered 2013-01-18: 800 mg via ORAL
  Filled 2013-01-18: qty 1

## 2013-01-18 MED ORDER — IBUPROFEN 800 MG PO TABS: 800.0000 mg | ORAL_TABLET | Freq: Three times a day (TID) | ORAL | Status: DC

## 2013-01-18 NOTE — Discharge Instructions (Signed)
Back Pain, Adult Low back pain is very common. About 1 in 5 people have back pain.The cause of low back pain is rarely dangerous. The pain often gets better over time.About half of people with a sudden onset of back pain feel better in just 2 weeks. About 8 in 10 people feel better by 6 weeks.  CAUSES Some common causes of back pain include:  Strain of the muscles or ligaments supporting the spine.  Wear and tear (degeneration) of the spinal discs.  Arthritis.  Direct injury to the back. DIAGNOSIS Most of the time, the direct cause of low back pain is not known.However, back pain can be treated effectively even when the exact cause of the pain is unknown.Answering your caregiver's questions about your overall health and symptoms is one of the most accurate ways to make sure the cause of your pain is not dangerous. If your caregiver needs more information, he or she may order lab work or imaging tests (X-rays or MRIs).However, even if imaging tests show changes in your back, this usually does not require surgery. HOME CARE INSTRUCTIONS For many people, back pain returns.Since low back pain is rarely dangerous, it is often a condition that people can learn to manageon their own.   Remain active. It is stressful on the back to sit or stand in one place. Do not sit, drive, or stand in one place for more than 30 minutes at a time. Take short walks on level surfaces as soon as pain allows.Try to increase the length of time you walk each day.  Do not stay in bed.Resting more than 1 or 2 days can delay your recovery.  Do not avoid exercise or work.Your body is made to move.It is not dangerous to be active, even though your back may hurt.Your back will likely heal faster if you return to being active before your pain is gone.  Pay attention to your body when you bend and lift. Many people have less discomfortwhen lifting if they bend their knees, keep the load close to their bodies,and  avoid twisting. Often, the most comfortable positions are those that put less stress on your recovering back.  Find a comfortable position to sleep. Use a firm mattress and lie on your side with your knees slightly bent. If you lie on your back, put a pillow under your knees.  Only take over-the-counter or prescription medicines as directed by your caregiver. Over-the-counter medicines to reduce pain and inflammation are often the most helpful.Your caregiver may prescribe muscle relaxant drugs.These medicines help dull your pain so you can more quickly return to your normal activities and healthy exercise.  Put ice on the injured area.  Put ice in a plastic bag.  Place a towel between your skin and the bag.  Leave the ice on for 15-20 minutes, 03-04 times a day for the first 2 to 3 days. After that, ice and heat may be alternated to reduce pain and spasms.  Ask your caregiver about trying back exercises and gentle massage. This may be of some benefit.  Avoid feeling anxious or stressed.Stress increases muscle tension and can worsen back pain.It is important to recognize when you are anxious or stressed and learn ways to manage it.Exercise is a great option. SEEK MEDICAL CARE IF:  You have pain that is not relieved with rest or medicine.  You have pain that does not improve in 1 week.  You have new symptoms.  You are generally not feeling well. SEEK   IMMEDIATE MEDICAL CARE IF:   You have pain that radiates from your back into your legs.  You develop new bowel or bladder control problems.  You have unusual weakness or numbness in your arms or legs.  You develop nausea or vomiting.  You develop abdominal pain.  You feel faint. Document Released: 12/20/2004 Document Revised: 06/21/2011 Document Reviewed: 05/10/2010 [employer] Patient Information 2014 [employer].  

## 2013-01-18 NOTE — ED Provider Notes (Signed)
CSN: [id number]     Arrival date & time 01/18/13  0106 History   First MD Initiated Contact with Patient 01/18/13 [phone]     Chief Complaint  Patient presents with  . Flank Pain   (Consider location/radiation/quality/duration/timing/severity/associated sxs/prior Treatment) HPI History provided by patient. History of chronic back pain presenting with left flank pain onset tonight with dark urine. Has history of kidney stones patient worried he may have the same. Pain sharp in quality and moderate to severe and is not radiating. It is worse with movement. No nausea vomiting. No hematuria. No recalled injury. Patient has been less active with cold weather recently.  Past Medical History  Diagnosis Date  . No significant past medical history   . Disc degeneration   . Disc degeneration   . Blind right eye   . Chronic back pain   . Degenerative disc disease    Past Surgical History  Procedure Laterality Date  . Degenerative cervical disc     History reviewed. No pertinent family history. History  Substance Use Topics  . Smoking status: Current Every Day Smoker -- 1.00 packs/day    Types: Cigarettes  . Smokeless tobacco: Former Systems developer  . Alcohol Use: No    Review of Systems  Constitutional: Negative for fever and chills.  Eyes: Negative for pain.  Respiratory: Negative for shortness of breath.   Cardiovascular: Negative for chest pain.  Gastrointestinal: Negative for abdominal pain.  Genitourinary: Negative for dysuria and testicular pain.  Musculoskeletal: Positive for back pain. Negative for neck pain and neck stiffness.  Skin: Negative for rash.  Neurological: Negative for headaches.  All other systems reviewed and are negative.    Allergies  Chicken allergy and Penicillins  Home Medications   Current Outpatient Rx  Name  Route  Sig  Dispense  Refill  . cyclobenzaprine (FLEXERIL) 10 MG tablet   Oral   Take 1 tablet (10 mg total) by mouth 2 (two) times daily as needed  for muscle spasms.   20 tablet   0   . HYDROcodone-acetaminophen (NORCO/VICODIN) 5-325 MG per tablet   Oral   Take 1-2 tablets by mouth every 4 (four) hours as needed for pain.   15 tablet   0   . methocarbamol (ROBAXIN) 750 MG tablet   Oral   Take 750 mg by mouth daily as needed (muscle spasm). Muscle pain          BP 121/78  Pulse 73  Temp(Src) 97.5 F (36.4 C) (Oral)  Resp 12  SpO2 97% Physical Exam  Constitutional: He is oriented to person, place, and time. He appears well-developed and well-nourished.  HENT:  Head: Normocephalic and atraumatic.  Eyes: EOM are normal. Pupils are equal, round, and reactive to light.  Neck: Neck supple.  Cardiovascular: Normal rate, regular rhythm and intact distal pulses.   Pulmonary/Chest: Effort normal and breath sounds normal. No respiratory distress.  Musculoskeletal: Normal range of motion. He exhibits no edema.  Left flank tenderness without midline thoracic or lumbar tenderness or deformity. No lower extremity deficits with DTRs intact, equal dorsi plantar flexion and sensorium to light touch equal intact throughout.  Neurological: He is alert and oriented to person, place, and time.  Skin: Skin is warm and dry.    ED Course  Procedures (including critical care time) Labs Review Labs Reviewed  BASIC METABOLIC PANEL - Abnormal; Notable for the following:    GFR calc non Af Amer 82 (*)    All other components  within normal limits  POCT I-STAT, CHEM 8 - Abnormal; Notable for the following:    Calcium, Ion 1.24 (*)    All other components within normal limits  URINALYSIS, ROUTINE W REFLEX MICROSCOPIC   Imaging Review Ct Abdomen Pelvis Wo Contrast  01/18/2013   CLINICAL DATA:  Left flank pain.  History of kidney stones  EXAM: CT ABDOMEN AND PELVIS WITHOUT CONTRAST  TECHNIQUE: Multidetector CT imaging of the abdomen and pelvis was performed following the standard protocol without intravenous contrast.  COMPARISON:  None  available for review  FINDINGS: BODY WALL: Small fatty left inguinal hernia.  LOWER CHEST: Mild scarring or atelectasis in the lingula and right middle lobe.  ABDOMEN/PELVIS:  Liver: No focal abnormality.  Biliary: No evidence of biliary obstruction or stone.  Pancreas: Unremarkable.  Spleen: Unremarkable.  Adrenals: Unremarkable.  Kidneys and ureters: No hydronephrosis or stone.  Bladder: Unremarkable.  Reproductive: Unremarkable.  Bowel: No obstruction. Normal appendix.  Retroperitoneum: No mass or adenopathy.  Peritoneum: No free fluid or gas.  Vascular: Early bilateral iliac atherosclerosis.  OSSEOUS: No acute osseous findings. 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 Minor disc bulges and ligamentous thickening contributes to the diffuse canal narrowing. Disc bulges effaces the inferior foramina at multiple lumbar levels, without focal/advanced stenosis to definitively explain left-sided flank pain.  IMPRESSION: 1. No hydronephrosis or nephrolithiasis. 2. Fatty left inguinal hernia.   Electronically Signed   By: Jorje Guild M.D.   On: 01/18/2013 05:32   IM Dilaudid, Motrin/ Flexeril provided  Pain improved. Plan discharge home with outpatient referral. Patient is already taking Robaxin. Previous records reviewed including ED visits. Back pain precautions provided and verbalized as understood.  MDM  Diagnosis:  back pain  Evaluated with urinalysis, labs and imaging are reviewed as above - no kidney stone. No UTI. Improved with medication is provided    Teressa Lower, MD 01/18/13 [phone]

## 2013-01-18 NOTE — ED Notes (Signed)
Pt c/o left sided back and flank pain with difficulty urinating. Urine described as dark. Hx kidney stones.

## 2013-04-25 ENCOUNTER — Emergency Department (HOSPITAL_COMMUNITY): Payer: Medicare Other

## 2013-04-25 ENCOUNTER — Emergency Department (HOSPITAL_COMMUNITY)
Admission: EM | Admit: 2013-04-25 | Discharge: 2013-04-25 | Disposition: A | Discharge status: Home / Self Care | Payer: Medicare Other | Attending: Emergency Medicine | Admitting: Emergency Medicine

## 2013-04-25 ENCOUNTER — Encounter (HOSPITAL_COMMUNITY): Payer: Self-pay | Admitting: Emergency Medicine

## 2013-04-25 DIAGNOSIS — Y9289 Other specified places as the place of occurrence of the external cause: Secondary | ICD-10-CM | POA: Insufficient documentation

## 2013-04-25 DIAGNOSIS — W010XXA Fall on same level from slipping, tripping and stumbling without subsequent striking against object, initial encounter: Secondary | ICD-10-CM | POA: Insufficient documentation

## 2013-04-25 DIAGNOSIS — F17200 Nicotine dependence, unspecified, uncomplicated: Secondary | ICD-10-CM | POA: Insufficient documentation

## 2013-04-25 DIAGNOSIS — S79929A Unspecified injury of unspecified thigh, initial encounter: Secondary | ICD-10-CM

## 2013-04-25 DIAGNOSIS — Z880 Allergy status to penicillin: Secondary | ICD-10-CM | POA: Insufficient documentation

## 2013-04-25 DIAGNOSIS — S79919A Unspecified injury of unspecified hip, initial encounter: Secondary | ICD-10-CM | POA: Insufficient documentation

## 2013-04-25 DIAGNOSIS — IMO0002 Reserved for concepts with insufficient information to code with codable children: Secondary | ICD-10-CM | POA: Insufficient documentation

## 2013-04-25 DIAGNOSIS — S3981XA Other specified injuries of abdomen, initial encounter: Secondary | ICD-10-CM | POA: Insufficient documentation

## 2013-04-25 DIAGNOSIS — T148XXA Other injury of unspecified body region, initial encounter: Secondary | ICD-10-CM | POA: Insufficient documentation

## 2013-04-25 DIAGNOSIS — G8929 Other chronic pain: Secondary | ICD-10-CM | POA: Insufficient documentation

## 2013-04-25 DIAGNOSIS — Y939 Activity, unspecified: Secondary | ICD-10-CM | POA: Insufficient documentation

## 2013-04-25 LAB — URINALYSIS, ROUTINE W REFLEX MICROSCOPIC
Bilirubin Urine: NEGATIVE
Glucose, UA: NEGATIVE mg/dL
Hgb urine dipstick: NEGATIVE
KETONES UR: NEGATIVE mg/dL
Leukocytes, UA: NEGATIVE
NITRITE: NEGATIVE
Protein, ur: NEGATIVE mg/dL
Specific Gravity, Urine: 1.019 (ref 1.005–1.030)
Urobilinogen, UA: 0.2 mg/dL (ref 0.0–1.0)
pH: 5.5 (ref 5.0–8.0)

## 2013-04-25 MED ORDER — OXYCODONE-ACETAMINOPHEN 5-325 MG PO TABS: 2.0000 | ORAL_TABLET | ORAL | Status: DC | PRN

## 2013-04-25 NOTE — ED Notes (Signed)
Patient transported to X-ray 

## 2013-04-25 NOTE — ED Provider Notes (Signed)
CSN: [id number]     Arrival date & time 04/25/13  1157 History   First MD Initiated Contact with Patient 04/25/13 1354     Chief Complaint  Patient presents with  . Fall     (Consider location/radiation/quality/duration/timing/severity/associated sxs/prior Treatment) HPI Comments: Patient presents to the ER for evaluation of left flank injury. Patient reports that he slipped getting out of the shower last night and fell, hitting his left lower back and flank area on the toilet. Patient having pain in the posterior ribs as well as lower back region. He has some pain in the left hip with walking. He did not hit his head or lose consciousness. No neck pain, no midline back pain. He does have a history of bad back, however.  Patient is a 48 y.o. male presenting with fall.  Fall Pertinent negatives include no headaches and no shortness of breath.    Past Medical History  Diagnosis Date  . No significant past medical history   . Disc degeneration   . Disc degeneration   . Blind right eye   . Chronic back pain   . Degenerative disc disease    Past Surgical History  Procedure Laterality Date  . Degenerative cervical disc     No family history on file. History  Substance Use Topics  . Smoking status: Current Every Day Smoker -- 1.00 packs/day    Types: Cigarettes  . Smokeless tobacco: Former Systems developer  . Alcohol Use: No    Review of Systems  Respiratory: Negative for shortness of breath.   Musculoskeletal: Positive for back pain.  Skin:       bruising  Neurological: Negative for syncope, numbness and headaches.  All other systems reviewed and are negative.     Allergies  Chicken allergy; Penicillins; and Tramadol  Home Medications   Prior to Admission medications   Medication Sig Start Date End Date Taking? Authorizing Provider  aspirin-acetaminophen-caffeine (EXCEDRIN MIGRAINE) [phone] MG per tablet Take 2 tablets by mouth every 6 (six) hours as needed for headache.    Yes Historical Provider, MD  oxyCODONE-acetaminophen (PERCOCET/ROXICET) 5-325 MG per tablet Take 1 tablet by mouth every 4 (four) hours as needed for severe pain.   Yes Historical Provider, MD   BP 119/78  Pulse 56  Temp(Src) 97.7 F (36.5 C) (Oral)  Resp 20  SpO2 100% Physical Exam  Constitutional: He is oriented to person, place, and time. He appears well-developed and well-nourished. No distress.  HENT:  Head: Normocephalic and atraumatic.  Right Ear: Hearing normal.  Left Ear: Hearing normal.  Nose: Nose normal.  Mouth/Throat: Oropharynx is clear and moist and mucous membranes are normal.  Eyes: Conjunctivae and EOM are normal. Pupils are equal, round, and reactive to light.  Neck: Normal range of motion. Neck supple.  Cardiovascular: Regular rhythm, S1 normal and S2 normal.  Exam reveals no gallop and no friction rub.   No murmur heard. Pulmonary/Chest: Effort normal and breath sounds normal. No respiratory distress.   He exhibits tenderness. He exhibits no crepitus.  Abdominal: Soft. Normal appearance and bowel sounds are normal. There is no hepatosplenomegaly. There is no tenderness. There is no rebound, no guarding, no tenderness at McBurney's point and negative Murphy's sign. No hernia.  Musculoskeletal: Normal range of motion.       Lumbar back: He exhibits no bony tenderness.       Back:  Neurological: He is alert and oriented to person, place, and time. He has normal strength. No cranial  nerve deficit or sensory deficit. Coordination normal. GCS eye subscore is 4. GCS verbal subscore is 5. GCS motor subscore is 6.  Skin: Skin is warm, dry and intact. No rash noted. No cyanosis.  Psychiatric: He has a normal mood and affect. His speech is normal and behavior is normal. Thought content normal.    ED Course  Procedures (including critical care time) Labs Review Labs Reviewed  URINALYSIS, ROUTINE W REFLEX MICROSCOPIC    Imaging Review Dg Ribs Unilateral W/chest  Left  04/25/2013   CLINICAL DATA:  Left-sided rib pain status post fall  EXAM: LEFT RIBS AND CHEST - 3+ VIEW  COMPARISON:  None.  FINDINGS: No fracture or other bone lesions are seen involving the ribs. There is no evidence of pneumothorax or pleural effusion. Both lungs are clear. Heart size and mediastinal contours are within normal limits.  IMPRESSION: Negative.   Electronically Signed   By: Kathreen Devoid   On: 04/25/2013 14:47   Dg Lumbar Spine Complete  04/25/2013   CLINICAL DATA:  Fall from chair onto toilet. Left posterior rib and hip pain.  EXAM: LUMBAR SPINE - COMPLETE 4+ VIEW  COMPARISON:  CT 01/18/2013.  Radiographs 06/15/2003.  FINDINGS: Five lumbar type vertebral bodies remain in anatomic alignment. There is no evidence of acute fracture or pars defect. Compared with the prior radiographs, there is increased intervertebral spurring at L2-3. The disc spaces are largely preserved.  IMPRESSION: No acute osseous findings or malalignment. Increased intervertebral spurring at L2-3.   Electronically Signed   By: Camie Patience M.D.   On: 04/25/2013 14:46   Dg Hip Complete Left  04/25/2013   CLINICAL DATA:  Left back and hip pain  EXAM: LEFT HIP - COMPLETE 2+ VIEW  COMPARISON:  None.  FINDINGS: There is no evidence of hip fracture or dislocation. There is no evidence of arthropathy or other focal bone abnormality.  IMPRESSION: Negative.   Electronically Signed   By: Kathreen Devoid   On: 04/25/2013 14:46     EKG Interpretation None      MDM   Final diagnoses:  None    Percent with pain in the left flank and back area. Patient fell and hit this area on the commode. His abrasion to the left lower back region. Urinalysis was performed, no blood in the urine, no concern for kidney injury. Left rib x-ray and chest x-ray were normal, no fractures, contusion, pneumothorax. The patient had complaints of pain in the left hip area as well, so lumbar sacral and hip x-rays were performed, were negative.  Patient reassured, continue analgesia.    Orpah Greek, MD 04/25/13 [phone]

## 2013-04-25 NOTE — ED Notes (Signed)
Pt got out of the shower slipped and fell and hit toilet  With left mid back and complains of pain to left hip.  Pt denies urinating blood

## 2013-04-25 NOTE — Discharge Instructions (Signed)
Contusion A contusion is a deep bruise. Contusions are the result of an injury that caused bleeding under the skin. The contusion may turn blue, purple, or yellow. Minor injuries will give you a painless contusion, but more severe contusions may stay painful and swollen for a few weeks.  CAUSES  A contusion is usually caused by a blow, trauma, or direct force to an area of the body. SYMPTOMS   Swelling and redness of the injured area.  Bruising of the injured area.  Tenderness and soreness of the injured area.  Pain. DIAGNOSIS  The diagnosis can be made by taking a history and physical exam. An X-ray, CT scan, or MRI may be needed to determine if there were any associated injuries, such as fractures. TREATMENT  Specific treatment will depend on what area of the body was injured. In general, the best treatment for a contusion is resting, icing, elevating, and applying cold compresses to the injured area. Over-the-counter medicines may also be recommended for pain control. Ask your caregiver what the best treatment is for your contusion. HOME CARE INSTRUCTIONS   Put ice on the injured area.  Put ice in a plastic bag.  Place a towel between your skin and the bag.  Leave the ice on for 15-20 minutes, 03-04 times a day.  Only take over-the-counter or prescription medicines for pain, discomfort, or fever as directed by your caregiver. Your caregiver may recommend avoiding anti-inflammatory medicines (aspirin, ibuprofen, and naproxen) for 48 hours because these medicines may increase bruising.  Rest the injured area.  If possible, elevate the injured area to reduce swelling. SEEK IMMEDIATE MEDICAL CARE IF:   You have increased bruising or swelling.  You have pain that is getting worse.  Your swelling or pain is not relieved with medicines. MAKE SURE YOU:   Understand these instructions.  Will watch your condition.  Will get help right away if you are not doing well or get  worse. Document Released: 09/29/2004 Document Revised: 03/14/2011 Document Reviewed: 10/25/2010 [facility] Patient Information 2014 [place], [place]. Blunt Trauma You have been evaluated for injuries. You have been examined and your caregiver has not found injuries serious enough to require hospitalization. It is common to have multiple bruises and sore muscles following an accident. These tend to feel worse for the first 24 hours. You will feel more stiffness and soreness over the next several hours and worse when you wake up the first morning after your accident. After this point, you should begin to improve with each passing day. The amount of improvement depends on the amount of damage done in the accident. Following your accident, if some part of your body does not work as it should, or if the pain in any area continues to increase, you should return to the Emergency Department for re-evaluation.  HOME CARE INSTRUCTIONS  Routine care for sore areas should include:  Ice to sore areas every 2 hours for 20 minutes while awake for the next 2 days.  Drink extra fluids (not alcohol).  Take a hot or warm shower or bath once or twice a day to increase blood flow to sore muscles. This will help you "limber up".  Activity as tolerated. Lifting may aggravate neck or back pain.  Only take over-the-counter or prescription medicines for pain, discomfort, or fever as directed by your caregiver. Do not use aspirin. This may increase bruising or increase bleeding if there are small areas where this is happening. SEEK IMMEDIATE MEDICAL CARE IF:  Numbness,  tingling, weakness, or problem with the use of your arms or legs.  A severe headache is not relieved with medications.  There is a change in bowel or bladder control.  Increasing pain in any areas of the body.  Short of breath or dizzy.  Nauseated, vomiting, or sweating.  Increasing belly (abdominal) discomfort.  Blood in urine, stool, or  vomiting blood.  Pain in either shoulder in an area where a shoulder strap would be.  Feelings of lightheadedness or if you have a fainting episode. Sometimes it is not possible to identify all injuries immediately after the trauma. It is important that you continue to monitor your condition after the emergency department visit. If you feel you are not improving, or improving more slowly than should be expected, call your physician. If you feel your symptoms (problems) are worsening, return to the Emergency Department immediately. Document Released: 09/15/2000 Document Revised: 03/14/2011 Document Reviewed: 08/08/2007 [facility] Patient Information 2014 [place].

## 2013-05-29 ENCOUNTER — Emergency Department (HOSPITAL_COMMUNITY): Payer: Medicare Other

## 2013-05-29 ENCOUNTER — Encounter (HOSPITAL_COMMUNITY): Payer: Self-pay | Admitting: Emergency Medicine

## 2013-05-29 ENCOUNTER — Emergency Department (HOSPITAL_COMMUNITY)
Admission: EM | Admit: 2013-05-29 | Discharge: 2013-05-29 | Disposition: A | Discharge status: Home / Self Care | Payer: Medicare Other | Source: Home / Self Care | Attending: Emergency Medicine | Admitting: Emergency Medicine

## 2013-05-29 ENCOUNTER — Emergency Department (HOSPITAL_COMMUNITY)
Admission: EM | Admit: 2013-05-29 | Discharge: 2013-05-29 | Disposition: A | Discharge status: Home / Self Care | Payer: Medicare Other | Attending: Emergency Medicine | Admitting: Emergency Medicine

## 2013-05-29 DIAGNOSIS — Z23 Encounter for immunization: Secondary | ICD-10-CM | POA: Insufficient documentation

## 2013-05-29 DIAGNOSIS — H5440 Blindness, one eye, unspecified eye: Secondary | ICD-10-CM | POA: Insufficient documentation

## 2013-05-29 DIAGNOSIS — S61200A Unspecified open wound of right index finger without damage to nail, initial encounter: Secondary | ICD-10-CM

## 2013-05-29 DIAGNOSIS — IMO0002 Reserved for concepts with insufficient information to code with codable children: Secondary | ICD-10-CM | POA: Insufficient documentation

## 2013-05-29 DIAGNOSIS — S61209A Unspecified open wound of unspecified finger without damage to nail, initial encounter: Secondary | ICD-10-CM | POA: Insufficient documentation

## 2013-05-29 DIAGNOSIS — Y9389 Activity, other specified: Secondary | ICD-10-CM | POA: Insufficient documentation

## 2013-05-29 DIAGNOSIS — Z880 Allergy status to penicillin: Secondary | ICD-10-CM | POA: Insufficient documentation

## 2013-05-29 DIAGNOSIS — S61011A Laceration without foreign body of right thumb without damage to nail, initial encounter: Secondary | ICD-10-CM

## 2013-05-29 DIAGNOSIS — W11XXXA Fall on and from ladder, initial encounter: Secondary | ICD-10-CM | POA: Insufficient documentation

## 2013-05-29 DIAGNOSIS — Y9289 Other specified places as the place of occurrence of the external cause: Secondary | ICD-10-CM | POA: Insufficient documentation

## 2013-05-29 DIAGNOSIS — G8929 Other chronic pain: Secondary | ICD-10-CM | POA: Insufficient documentation

## 2013-05-29 DIAGNOSIS — F17200 Nicotine dependence, unspecified, uncomplicated: Secondary | ICD-10-CM | POA: Insufficient documentation

## 2013-05-29 MED ORDER — CLINDAMYCIN HCL 150 MG PO CAPS: 300.0000 mg | ORAL_CAPSULE | Freq: Three times a day (TID) | ORAL | Status: DC

## 2013-05-29 MED ORDER — TETANUS-DIPHTH-ACELL PERTUSSIS 5-2.5-18.5 LF-MCG/0.5 IM SUSP
0.5000 mL | Freq: Once | INTRAMUSCULAR | Status: AC
  Administered 2013-05-29: 0.5 mL via INTRAMUSCULAR
  Filled 2013-05-29: qty 0.5

## 2013-05-29 NOTE — ED Provider Notes (Signed)
CSN: [id number]     Arrival date & time 05/29/13  1320 History  This chart was scribed for non-physician practitioner Alvina Chou, PA-C working with Osvaldo Shipper, MD by Zettie Pho, ED Scribe. This patient was seen in room TR07C/TR07C and the patient's care was started at 2:10 PM.    Chief Complaint  Patient presents with  . Extremity Laceration   Patient is a 48 y.o. male presenting with skin laceration. The history is provided by the patient. No language interpreter was used.  Laceration Location:  Hand Hand laceration location:  R finger Bleeding: controlled   Laceration mechanism:  Fall Pain details:    Severity:  Mild   Timing:  Constant   Progression:  Unchanged Tetanus status:  Out of date  HPI Comments: Timothy Boone is a 48 y.o. male who presents to the Emergency Department complaining of a laceration to the right, index finger that he sustained earlier today after falling from a ladder. The bleeding is well-controlled at this time. He reports an associated, constant, mild pain to the area around the wound. He reports that his tetanus vaccination is not UTD. Patient has allergies to penicillins and tramadol. Patient has no other pertinent medical history.   Past Medical History  Diagnosis Date  . No significant past medical history   . Disc degeneration   . Disc degeneration   . Blind right eye   . Chronic back pain   . Degenerative disc disease    Past Surgical History  Procedure Laterality Date  . Degenerative cervical disc     History reviewed. No pertinent family history. History  Substance Use Topics  . Smoking status: Current Every Day Smoker -- 1.00 packs/day    Types: Cigarettes  . Smokeless tobacco: Former Systems developer  . Alcohol Use: No    Review of Systems  Skin: Positive for wound.  All other systems reviewed and are negative.  Allergies  Chicken allergy; Penicillins; and Tramadol  Home Medications   Prior to Admission medications    Medication Sig Start Date End Date Taking? Authorizing Provider  aspirin-acetaminophen-caffeine (EXCEDRIN MIGRAINE) [phone] MG per tablet Take 2 tablets by mouth every 6 (six) hours as needed for headache.    Historical Provider, MD  oxyCODONE-acetaminophen (PERCOCET) 5-325 MG per tablet Take 2 tablets by mouth every 4 (four) hours as needed. 04/25/13   Orpah Greek, MD  oxyCODONE-acetaminophen (PERCOCET/ROXICET) 5-325 MG per tablet Take 1 tablet by mouth every 4 (four) hours as needed for severe pain.    Historical Provider, MD   Triage Vitals: BP 116/79  Pulse 78  Temp(Src) 98.2 F (36.8 C) (Oral)  Resp 18  SpO2 97%  Physical Exam  Nursing note and vitals reviewed. Constitutional: He is oriented to person, place, and time. He appears well-developed and well-nourished. No distress.  HENT:  Head: Normocephalic and atraumatic.  Eyes: Conjunctivae are normal.  Neck: Normal range of motion. Neck supple.  Pulmonary/Chest: Effort normal. No respiratory distress.  Abdominal: He exhibits no distension.  Musculoskeletal: Normal range of motion.  No snuff box tenderness. Full range of motion of the right wrist.   Neurological: He is alert and oriented to person, place, and time.  Sensation intact of right fingers.   Skin: Skin is warm and dry.  Deep, jagged wound of the lateral, right, 1st digit proximally. No bleeding.   Psychiatric: He has a normal mood and affect. His behavior is normal.    ED Course  Procedures (including critical  care time)  DIAGNOSTIC STUDIES: Oxygen Saturation is 97% on room air, normal by my interpretation.    COORDINATION OF CARE: 2:13 PM- Discussed that the laceration will not need to be repaired with sutures. Will perform wound care and order a tetanus vaccination. Discussed treatment plan with patient at bedside and patient verbalized agreement.    Labs Review Labs Reviewed - No data to display  Imaging Review No results found.   EKG  Interpretation None      MDM   Final diagnoses:  Open wound of right index finger without damage to nail    Patient will have tdap here. The wound appears to have a piece of skin missing. The wound is not appropriate for laceration repair. Patient will have bandage applied to protect the area until the wound heals. Vitals stable and patient afebrile.   I personally performed the services described in this documentation, which was scribed in my presence. The recorded information has been reviewed and is accurate.     Alvina Chou, [place] 05/30/13 [phone]

## 2013-05-29 NOTE — ED Notes (Signed)
Pt fell off ladder today at 12:00noon. Pt was seen and treated for lac to rt Index finger today . Pt went home and Pt reported ongoing pain to RT thumb. Pt reports taking a 5/325 Vicodin approx 1.5HRs ago.

## 2013-05-29 NOTE — ED Provider Notes (Signed)
CSN: [id number]     Arrival date & time 05/29/13  1909 History  This chart was scribed for non-physician practitioner Monico Blitz, PA-C working with [place], DO by Rolanda Lundborg, ED Scribe. This patient was seen in room TR04C/TR04C and the patient's care was started at 7:29 PM.    Chief Complaint  Patient presents with  . Hand Pain    RT Thumb   The history is provided by the patient. No language interpreter was used.    HPI Comments: Timothy Boone is a 48 y.o. male who presents to the Emergency Department complaining of gradually-worsening right thumb pain onset today when he fell off a ladder. Pt was seen here earlier for a laceration to the right index finger from the same accident and was discharged home after declining an x-ray. Pt is here because he now wants an x-ray. Pt is right-handed. He denies numbness or weakness. He has Vicodin leftover that he has been taking. He is on disability.    Past Medical History  Diagnosis Date  . No significant past medical history   . Disc degeneration   . Disc degeneration   . Blind right eye   . Chronic back pain   . Degenerative disc disease    Past Surgical History  Procedure Laterality Date  . Degenerative cervical disc     No family history on file. History  Substance Use Topics  . Smoking status: Current Every Day Smoker -- 1.00 packs/day    Types: Cigarettes  . Smokeless tobacco: Former Systems developer  . Alcohol Use: No    Review of Systems  Musculoskeletal: Positive for arthralgias (right thumb).  Neurological: Negative for numbness.  All other systems reviewed and are negative.     Allergies  Chicken allergy; Penicillins; and Tramadol  Home Medications   Prior to Admission medications   Not on File   BP 113/70  Pulse 85  Temp(Src) 98 F (36.7 C) (Oral)  Resp 15  Ht 6' (1.829 m)  Wt 226 lb (102.513 kg)  BMI 30.64 kg/m2  SpO2 97% Physical Exam  Nursing note and vitals reviewed. Constitutional: He is  oriented to person, place, and time. He appears well-developed and well-nourished. No distress.  HENT:  Head: Normocephalic.  Mouth/Throat: Oropharynx is clear and moist.  Eyes: Conjunctivae and EOM are normal. Pupils are equal, round, and reactive to light.  Neck: Normal range of motion. Neck supple.  Cardiovascular: Normal rate, regular rhythm and intact distal pulses.   Pulmonary/Chest: Effort normal and breath sounds normal. No stridor. No respiratory distress. He has no wheezes. He has no rales. He exhibits no tenderness.  Abdominal: Soft. Bowel sounds are normal. He exhibits no distension and no mass. There is no tenderness. There is no rebound and no guarding.  Musculoskeletal: Normal range of motion. He exhibits no edema and no tenderness.  Right wrist with no snuffbox tenderness. No deformity to right thumb. Right first digit - bandage in place. NVI.   Neurological: He is alert and oriented to person, place, and time.  Psychiatric: He has a normal mood and affect.    ED Course  Procedures (including critical care time) Medications - No data to display  DIAGNOSTIC STUDIES: Oxygen Saturation is 97% on RA, normal by my interpretation.    COORDINATION OF CARE: 9:40 PM- Discussed treatment plan with pt which includes discharge home with a sling and clindamycin. Referral to ortho. Pt agrees to plan.    Labs Review Labs Reviewed -  No data to display  Imaging Review Dg Finger Thumb Right  05/29/2013   CLINICAL DATA:  pain pain post fall  EXAM: RIGHT THUMB 2+V  COMPARISON:  None.  FINDINGS: There is no evidence of fracture or dislocation. There is no evidence of arthropathy or other focal bone abnormality. Calcific ossicles project about the interphalangeal joint .Soft tissues are unremarkable  IMPRESSION: Negative.   Electronically Signed   By: Arne Cleveland M.D.   On: 05/29/2013 21:25     EKG Interpretation None      MDM   Final diagnoses:  Laceration of right thumb     MDM Number of Diagnoses or Management Options Laceration of right thumb:   Filed Vitals:   05/29/13 1937 05/29/13 2206  BP: 113/70 120/82  Pulse: 85 66  Temp: 98 F (36.7 C) 98.7 F (37.1 C)  TempSrc: Oral Oral  Resp: 15 20  Height: 6' (1.829 m)   Weight: 226 lb (102.513 kg)   SpO2: 97% 99%    Medications - No data to display  Timothy Boone is a 48 y.o. male presenting with right thumb pain. Patient fell off a ladder earlier in the day. Had lacerations sewn. Patient has Vicodin and Percocet at home for chronic back pain. Physical exam with no snuffbox tenderness. X-ray negative. Advised him to follow closely with hand surgery. Patient started on clindamycin.  Evaluation does not show pathology that would require ongoing emergent intervention or inpatient treatment. Pt is hemodynamically stable and mentating appropriately. Discussed findings and plan with patient/guardian, who agrees with care plan. All questions answered. Return precautions discussed and outpatient follow up given.   Discharge Medication List as of 05/29/2013  9:37 PM    START taking these medications   Details  clindamycin (CLEOCIN) 150 MG capsule Take 2 capsules (300 mg total) by mouth 3 (three) times daily. May dispense as 150mg  capsules, Starting 05/29/2013, Until Discontinued, Print        Note: Portions of this report may have been transcribed using voice recognition software. Every effort was made to ensure accuracy; however, inadvertent computerized transcription errors may be present    I personally performed the services described in this documentation, which was scribed in my presence. The recorded information has been reviewed and is accurate.   Elmyra Ricks Jayceion Lisenby, PA-C 05/30/13 0231

## 2013-05-29 NOTE — ED Notes (Signed)
Regulatory affairs officer and he advised it was ok to pull till full pt back to FT.  Pt placed in room and FT RN advised pt was a pull till full.

## 2013-05-29 NOTE — ED Notes (Signed)
Pt present to ED with laceration noted to R hand and wrist pain after falling from a ladder; denies LOC. ~1-2 cm lac noted to hand; bleeding controlled; CMS intact

## 2013-05-29 NOTE — Discharge Instructions (Signed)
Keep wound clean and covered until it heals. Refer to attached documents for more information.

## 2013-05-29 NOTE — Discharge Instructions (Signed)
Take your antibiotics as directed and to completion. You should never have any leftover antibiotics! Push fluids and stay well hydrated.   Keep wound dry and do not remove dressing for 24 hours if possible. After that, wash gently morning and night (every 12 hours) with soap and water. Use a topical antibiotic ointment and cover with a bandaid or gauze.    Do NOT use rubbing alcohol or hydrogen peroxide, do not soak the area   Present to your primary care doctor or the urgent care of your choice, or the ED for suture removal in 7-10 days.   Every attempt was made to remove foreign body (contaminants) from the wound.  However, there is always a chance that some may remain in the wound. This can  increase your risk of infection.   If you see signs of infection (warmth, redness, tenderness, pus, sharp increase in pain, fever, red streaking in the skin) immediately return to the emergency department.   After the wound heals fully, apply sunscreen for 6-12 months to minimize scarring.  Do not hesitate to return to the Emergency Department for any new, worsening or concerning symptoms.   If you do not have a primary care doctor you can establish one at the   [facility]: [place] [id number] [phone]  After you establish care. Let them know you were seen in the emergency room. They must obtain records for further management.         Fingertip Injuries and Amputations Fingertip injuries are common and often get injured because they are last to escape when pulling your hand out of harm's way. You have amputated (cut off) part of your finger. How this turns out depends largely on how much was amputated. If just the tip is amputated, often the end of the finger will grow back and the finger may return to much the same as it was before the injury.  If more of the finger is missing, your caregiver has done the best with the tissue remaining to allow you to  keep as much finger as is possible. Your caregiver after checking your injury has tried to leave you with a painless fingertip that has durable, feeling skin. If possible, your caregiver has tried to maintain the finger's length and appearance and preserve its fingernail.  Please read the instructions outlined below and refer to this sheet in the next few weeks. These instructions provide you with general information on caring for yourself. Your caregiver may also give you specific instructions. While your treatment has been done according to the most current medical practices available, unavoidable complications occasionally occur. If you have any problems or questions after discharge, please call your caregiver. HOME CARE INSTRUCTIONS   You may resume normal diet and activities as directed or allowed.  Keep your hand elevated above the level of your heart. This helps decrease pain and swelling.  Keep ice packs (or a bag of ice wrapped in a towel) on the injured area for 15-20 minutes, 03-04 times per day, for the first two days.  Change dressings if necessary or as directed.  Clean the wound daily or as directed.  Only take over-the-counter or prescription medicines for pain, discomfort, or fever as directed by your caregiver.  Keep appointments as directed. SEEK IMMEDIATE MEDICAL CARE IF:  You develop redness, swelling, numbness or increasing pain in the wound.  There is pus coming from the wound.  You develop an unexplained oral temperature  above 102 F (38.9 C) or as your caregiver suggests.  There is a foul (bad) smell coming from the wound or dressing.  There is a breaking open of the wound (edges not staying together) after sutures or staples have been removed. MAKE SURE YOU:   Understand these instructions.  Will watch your condition.  Will get help right away if you are not doing well or get worse. Document Released: 11/10/2004 Document Revised: 03/14/2011 Document  Reviewed: 10/10/2007 [facility] Patient Information 2014 [place], [place].

## 2013-05-31 ENCOUNTER — Encounter (HOSPITAL_COMMUNITY): Payer: Self-pay | Admitting: Emergency Medicine

## 2013-05-31 ENCOUNTER — Emergency Department (HOSPITAL_COMMUNITY)
Admission: EM | Admit: 2013-05-31 | Discharge: 2013-05-31 | Disposition: A | Discharge status: Home / Self Care | Payer: Medicare Other | Attending: Emergency Medicine | Admitting: Emergency Medicine

## 2013-05-31 ENCOUNTER — Emergency Department (HOSPITAL_COMMUNITY): Payer: Medicare Other

## 2013-05-31 DIAGNOSIS — Z880 Allergy status to penicillin: Secondary | ICD-10-CM | POA: Insufficient documentation

## 2013-05-31 DIAGNOSIS — S6980XA Other specified injuries of unspecified wrist, hand and finger(s), initial encounter: Secondary | ICD-10-CM | POA: Insufficient documentation

## 2013-05-31 DIAGNOSIS — IMO0002 Reserved for concepts with insufficient information to code with codable children: Secondary | ICD-10-CM | POA: Insufficient documentation

## 2013-05-31 DIAGNOSIS — G8929 Other chronic pain: Secondary | ICD-10-CM | POA: Insufficient documentation

## 2013-05-31 DIAGNOSIS — S60511A Abrasion of right hand, initial encounter: Secondary | ICD-10-CM

## 2013-05-31 DIAGNOSIS — M79641 Pain in right hand: Secondary | ICD-10-CM

## 2013-05-31 DIAGNOSIS — H5440 Blindness, one eye, unspecified eye: Secondary | ICD-10-CM | POA: Insufficient documentation

## 2013-05-31 DIAGNOSIS — Z8739 Personal history of other diseases of the musculoskeletal system and connective tissue: Secondary | ICD-10-CM | POA: Insufficient documentation

## 2013-05-31 DIAGNOSIS — S6990XA Unspecified injury of unspecified wrist, hand and finger(s), initial encounter: Secondary | ICD-10-CM | POA: Insufficient documentation

## 2013-05-31 DIAGNOSIS — W11XXXA Fall on and from ladder, initial encounter: Secondary | ICD-10-CM | POA: Insufficient documentation

## 2013-05-31 DIAGNOSIS — F17200 Nicotine dependence, unspecified, uncomplicated: Secondary | ICD-10-CM | POA: Insufficient documentation

## 2013-05-31 DIAGNOSIS — Z792 Long term (current) use of antibiotics: Secondary | ICD-10-CM | POA: Insufficient documentation

## 2013-05-31 DIAGNOSIS — Y929 Unspecified place or not applicable: Secondary | ICD-10-CM | POA: Insufficient documentation

## 2013-05-31 DIAGNOSIS — Y9389 Activity, other specified: Secondary | ICD-10-CM | POA: Insufficient documentation

## 2013-05-31 NOTE — ED Notes (Signed)
Pt returned from radiology.

## 2013-05-31 NOTE — ED Notes (Signed)
Pt states fell off ladder on Wednesday--seen here for avulsion type injury twice on Wednesday-- started on clindamycin. Last dose was 1230 today. Hand continues to swell and has gotten red, no streaks noted. Bandage removed from wound-- hand swollen with pain over type of hand.

## 2013-05-31 NOTE — ED Notes (Signed)
Paged ortho for splint.

## 2013-05-31 NOTE — ED Provider Notes (Signed)
CSN: [id number]     Arrival date & time 05/31/13  1241 History   First MD Initiated Contact with Patient 05/31/13 1307    This chart was scribed for non-physician practitioner, Noland Fordyce, working with No att. providers found by Terressa Koyanagi, ED Scribe. This patient was seen in room TR08C/TR08C and the patient's care was started at 1:44 PM.  Chief Complaint  Patient presents with  . Hand Injury   The history is provided by the patient. No language interpreter was used.   HPI Comments: Timothy Boone is a 48 y.o. male who presents to the Emergency Department complaining of right hand pain and right thumb pain with associated intermittent swelling and redness in the right hand onset 2 days ago when pt fell off a ladder. Pt was seen and treated at the ED for the same two days ago and started on clindamycin. Pt reports he is compliant with his meds and his last dose of clindamycin was at 12:30PM today. Pt complains his right hand pain is most intense in the 2nd and 3rd MCP, and rates his pain a 6 out of 10. Pt rates his right thumb pain a 3 out of 10.   Past Medical History  Diagnosis Date  . No significant past medical history   . Disc degeneration   . Disc degeneration   . Blind right eye   . Chronic back pain   . Degenerative disc disease    Past Surgical History  Procedure Laterality Date  . Degenerative cervical disc     No family history on file. History  Substance Use Topics  . Smoking status: Current Every Day Smoker -- 1.00 packs/day    Types: Cigarettes  . Smokeless tobacco: Former Systems developer  . Alcohol Use: No    Review of Systems  Musculoskeletal:       Right hand pain and right thumb pain. Swelling of right hand.   All other systems reviewed and are negative.  Allergies  Chicken allergy; Penicillins; and Tramadol  Home Medications   Prior to Admission medications   Medication Sig Start Date End Date Taking? Authorizing Provider  clindamycin (CLEOCIN) 150 MG  capsule Take 2 capsules (300 mg total) by mouth 3 (three) times daily. May dispense as 150mg  capsules 05/29/13  Yes Nicole Pisciotta, PA-C  ibuprofen (ADVIL,MOTRIN) 200 MG tablet Take 800 mg by mouth every 6 (six) hours as needed for moderate pain.   Yes Historical Provider, MD   Triage Vitals: BP 124/71  Pulse 77  Temp(Src) 98.7 F (37.1 C) (Oral)  Resp 12  Ht 6' (1.829 m)  Wt 226 lb (102.513 kg)  BMI 30.64 kg/m2  SpO2 98%  Physical Exam  Nursing note and vitals reviewed. Constitutional: He appears well-developed and well-nourished.  HENT:  Head: Normocephalic and atraumatic.  Eyes: Conjunctivae are normal. No scleral icterus.  Neck: Normal range of motion.  Cardiovascular: Normal rate, regular rhythm and normal heart sounds.   Pulmonary/Chest: Effort normal and breath sounds normal. No respiratory distress. He has no wheezes. He has no rales. He exhibits no tenderness.  Abdominal: Soft. Bowel sounds are normal. He exhibits no distension and no mass. There is no tenderness. There is no rebound and no guarding.  Musculoskeletal: Normal range of motion. He exhibits tenderness (2nd and 3rd MCP. FROM). He exhibits no edema.  Right wrist with no snuffbox tenderness  Neurological: He is alert.  Skin: Skin is warm and dry.  1x1 cm skin avulsion to radial aspect  of 2nd MCP, scant red blood. No active bleeding.  Wound appears clean, surrounding skin full of dirt and black tar.  No surrounding erythema, no red streaking, no discharge, no evidence of underlying infection.     ED Course  Procedures (including critical care time) DIAGNOSTIC STUDIES: Oxygen Saturation is 98% on RA, normal by my interpretation.    COORDINATION OF CARE: 1:49 PM-Discussed treatment plan which includes imaging with pt at bedside and pt agreed to plan.  2:15 PM- Recheck, pt resting comfortably. Discussed further treatment plan which includes splint placement with pt, and pt agreed to plan. Pt also advised to ice  affected area.  Labs Review Labs Reviewed - No data to display  Imaging Review Dg Finger Thumb Right  05/29/2013   CLINICAL DATA:  pain pain post fall  EXAM: RIGHT THUMB 2+V  COMPARISON:  None.  FINDINGS: There is no evidence of fracture or dislocation. There is no evidence of arthropathy or other focal bone abnormality. Calcific ossicles project about the interphalangeal joint .Soft tissues are unremarkable  IMPRESSION: Negative.   Electronically Signed   By: Arne Cleveland M.D.   On: 05/29/2013 21:25     EKG Interpretation None      MDM   Final diagnoses:  Right hand pain  Abrasion of right hand    Pt c/o right hand pain, abrasion present, no evidence of underlying infection. Right hand-neurovascularly in tact.  Plain films: negative. Wound cleaned and bandaged. Wrist splint placed. Advised to continue taking antibiotic. F/u with PCP and Dr. Burney Gauze, hand surgery, if symptoms not improving. Return precautions provided. Pt verbalized understanding and agreement with tx plan.   I personally performed the services described in this documentation, which was scribed in my presence. The recorded information has been reviewed and is accurate.    Noland Fordyce, PA-C 05/31/13 1627

## 2013-05-31 NOTE — Discharge Instructions (Signed)
Continue to take all of your clindamycin until it is completed.  Follow up with family medicine or hand surgery for continued hand pain.   Abrasions An abrasion is a cut or scrape of the skin. Abrasions do not go through all layers of the skin. HOME CARE  If a bandage (dressing) was put on your wound, change it as told by your doctor. If the bandage sticks, soak it off with warm.  Wash the area with water and soap 2 times a day. Rinse off the soap. Pat the area dry with a clean towel.  Put on medicated cream (ointment) as told by your doctor.  Change your bandage right away if it gets wet or dirty.  Only take medicine as told by your doctor.  See your doctor within 24 48 hours to get your wound checked.  Check your wound for redness, puffiness (swelling), or yellowish-white fluid (pus). GET HELP RIGHT AWAY IF:   You have more pain in the wound.  You have redness, swelling, or tenderness around the wound.  You have pus coming from the wound.  You have a fever or lasting symptoms for more than 2 3 days.  You have a fever and your symptoms suddenly get worse.  You have a bad smell coming from the wound or bandage. MAKE SURE YOU:   Understand these instructions.  Will watch your condition.  Will get help right away if you are not doing well or get worse. Document Released: 06/08/2007 Document Revised: 09/14/2011 Document Reviewed: 11/23/2010 [facility] Patient Information 2014 [place], [place].

## 2013-05-31 NOTE — ED Provider Notes (Signed)
History/physical exam/procedure(s) were performed by non-physician practitioner and as supervising physician I was immediately available for consultation/collaboration. I have reviewed all notes and am in agreement with care and plan.   Berneta Sconyers S Louetta Hollingshead, MD 05/31/13 1654 

## 2013-05-31 NOTE — ED Provider Notes (Signed)
Medical screening examination/treatment/procedure(s) were performed by non-physician practitioner and as supervising physician I was immediately available for consultation/collaboration.   EKG Interpretation None        William Clementine Soulliere, MD 05/31/13 1600 

## 2013-06-01 NOTE — ED Provider Notes (Signed)
Medical screening examination/treatment/procedure(s) were performed by non-physician practitioner and as supervising physician I was immediately available for consultation/collaboration.   EKG Interpretation None        [place], DO 06/01/13 [phone]

## 2013-07-17 ENCOUNTER — Encounter (HOSPITAL_COMMUNITY): Payer: Self-pay | Admitting: Emergency Medicine

## 2013-07-17 ENCOUNTER — Emergency Department (HOSPITAL_COMMUNITY)
Admission: EM | Admit: 2013-07-17 | Discharge: 2013-07-17 | Disposition: A | Discharge status: Home / Self Care | Payer: Medicare Other | Attending: Emergency Medicine | Admitting: Emergency Medicine

## 2013-07-17 DIAGNOSIS — Z880 Allergy status to penicillin: Secondary | ICD-10-CM | POA: Insufficient documentation

## 2013-07-17 DIAGNOSIS — G8929 Other chronic pain: Secondary | ICD-10-CM | POA: Insufficient documentation

## 2013-07-17 DIAGNOSIS — F17200 Nicotine dependence, unspecified, uncomplicated: Secondary | ICD-10-CM | POA: Insufficient documentation

## 2013-07-17 DIAGNOSIS — Z202 Contact with and (suspected) exposure to infections with a predominantly sexual mode of transmission: Secondary | ICD-10-CM | POA: Insufficient documentation

## 2013-07-17 DIAGNOSIS — IMO0002 Reserved for concepts with insufficient information to code with codable children: Secondary | ICD-10-CM | POA: Insufficient documentation

## 2013-07-17 DIAGNOSIS — H5440 Blindness, one eye, unspecified eye: Secondary | ICD-10-CM | POA: Insufficient documentation

## 2013-07-17 DIAGNOSIS — Z792 Long term (current) use of antibiotics: Secondary | ICD-10-CM | POA: Insufficient documentation

## 2013-07-17 LAB — RPR

## 2013-07-17 LAB — HIV ANTIBODY (ROUTINE TESTING W REFLEX): HIV 1&2 Ab, 4th Generation: NONREACTIVE

## 2013-07-17 MED ORDER — CEFTRIAXONE SODIUM 250 MG IJ SOLR
250.0000 mg | Freq: Once | INTRAMUSCULAR | Status: AC
  Administered 2013-07-17: 250 mg via INTRAMUSCULAR
  Filled 2013-07-17: qty 250

## 2013-07-17 MED ORDER — AZITHROMYCIN 1 G PO PACK
1.0000 g | PACK | Freq: Once | ORAL | Status: AC
  Administered 2013-07-17: 1 g via ORAL
  Filled 2013-07-17: qty 1

## 2013-07-17 MED ORDER — METRONIDAZOLE 500 MG PO TABS
2000.0000 mg | ORAL_TABLET | Freq: Once | ORAL | Status: AC
  Administered 2013-07-17: 2000 mg via ORAL
  Filled 2013-07-17: qty 4

## 2013-07-17 NOTE — ED Provider Notes (Signed)
CSN: [id number]     Arrival date & time 07/17/13  1021 History  This chart was scribed for non-physician practitioner Cleatrice Burke, PA-C, working with Mirna Mires, MD by Ludger Nutting, ED Scribe. This patient was seen in room TR08C/TR08C and the patient's care was started at 11:03 AM.    Chief Complaint  Patient presents with  . Exposure to STD    The history is provided by the patient. No language interpreter was used.   HPI Comments: Timothy Boone is a 48 y.o. male who presents to the Emergency Department requesting an STI check today. Patient states his girlfriend is experiencing vaginal symptoms but has not been tested yet. Patient complains of dyspareunia. He is unable to say how long this has been going on. He denies any modifying factors.  Patient has no other complaints at this time and denies penile discharge, dysuria, urinary frequency, urinary urgency.   Past Medical History  Diagnosis Date  . No significant past medical history   . Disc degeneration   . Disc degeneration   . Blind right eye   . Chronic back pain   . Degenerative disc disease    Past Surgical History  Procedure Laterality Date  . Degenerative cervical disc     History reviewed. No pertinent family history. History  Substance Use Topics  . Smoking status: Current Every Day Smoker -- 1.00 packs/day    Types: Cigarettes  . Smokeless tobacco: Former Systems developer  . Alcohol Use: No    Review of Systems  Genitourinary:       Dyspareunia   All other systems reviewed and are negative.     Allergies  Chicken allergy; Penicillins; and Tramadol  Home Medications   Prior to Admission medications   Medication Sig Start Date End Date Taking? Authorizing Provider  clindamycin (CLEOCIN) 150 MG capsule Take 2 capsules (300 mg total) by mouth 3 (three) times daily. May dispense as 150mg  capsules 05/29/13   Nicole Pisciotta, PA-C  ibuprofen (ADVIL,MOTRIN) 200 MG tablet Take 800 mg by mouth every 6 (six) hours as  needed for moderate pain.    Historical Provider, MD   BP 148/90  Pulse 76  Temp(Src) 98.5 F (36.9 C) (Oral)  Resp 20  Wt 180 lb (81.647 kg)  SpO2 100% Physical Exam  Nursing note and vitals reviewed. Constitutional: He is oriented to person, place, and time. He appears well-developed and well-nourished. No distress.  HENT:  Head: Normocephalic and atraumatic.  Right Ear: External ear normal.  Left Ear: External ear normal.  Nose: Nose normal.  Eyes: Conjunctivae are normal.  Neck: Normal range of motion. No tracheal deviation present.  Cardiovascular: Normal rate, regular rhythm and normal heart sounds.   Pulmonary/Chest: Effort normal and breath sounds normal. No stridor.  Abdominal: Soft. He exhibits no distension. There is no tenderness. Hernia confirmed negative in the right inguinal area and confirmed negative in the left inguinal area.  Genitourinary: Testes normal and penis normal. Right testis shows no mass, no swelling and no tenderness. Left testis shows no mass, no swelling and no tenderness. Circumcised. No penile erythema or penile tenderness. No discharge found.  Musculoskeletal: Normal range of motion.  Lymphadenopathy:       Right: No inguinal adenopathy present.       Left: No inguinal adenopathy present.  Neurological: He is alert and oriented to person, place, and time.  Skin: Skin is warm and dry. He is not diaphoretic.  Psychiatric: He has a normal  mood and affect. His behavior is normal.    ED Course  Procedures (including critical care time)  DIAGNOSTIC STUDIES: Oxygen Saturation is 100% on RA, normal by my interpretation.    COORDINATION OF CARE: 11:10 AM Discussed treatment plan with pt at bedside and pt agreed to plan.   Labs Review Labs Reviewed  GC/CHLAMYDIA PROBE AMP  RPR  HIV ANTIBODY (ROUTINE TESTING)    Imaging Review No results found.   EKG Interpretation None      MDM   Final diagnoses:  Possible exposure to STD     Patient presents to ED for evaluation of STD. He reports that his girlfriend is having vaginal discharge. No known STD. He is concerned because things are "getting serious" with this woman. He was given azithromycin and rocephin in ED as well as 2,000mg  of Flagyl for possible trichomoniasis. Discussed with patient that these are not tests that come back today and he will receive a phone call if any of these are positive. Patient is otherwise asymptomatic. Discussed reasons to return to ED immediately. Vital signs stable for discharge. Patient / Family / Caregiver informed of clinical course, understand medical decision-making process, and agree with plan.   I personally performed the services described in this documentation, which was scribed in my presence. The recorded information has been reviewed and is accurate.    Elwyn Lade, PA-C 07/17/13 1128

## 2013-07-17 NOTE — ED Notes (Signed)
Pt in requesting to be checked for STD's, states girlfriend has been having symptoms, patient c/o some pain during intercourse, denies penile discharge

## 2013-07-17 NOTE — ED Provider Notes (Signed)
Medical screening examination/treatment/procedure(s) were performed by non-physician practitioner and as supervising physician I was immediately available for consultation/collaboration.     Mirna Mires, MD 07/17/13 1215

## 2013-07-17 NOTE — ED Notes (Signed)
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 RN discussed with pt his GF's need to be treated as well. PT states she has no insurance. PT's GF referred to local [facility].

## 2013-07-17 NOTE — Discharge Instructions (Signed)
Sexually Transmitted Disease A sexually transmitted disease (STD) is a disease or infection often passed to another person during sex. However, STDs can be passed through nonsexual ways. An STD can be passed through:  Spit (saliva).  Semen.  Blood.  Mucus from the vagina.  Pee (urine). HOW CAN I LESSEN MY CHANCES OF GETTING AN STD?  Use:  Latex condoms.  Water-soluble lubricants with condoms. Do not use petroleum jelly or oils.  Dental dams. These are small pieces of latex that are used as a barrier during oral sex.  Avoid having more than one sex partner.  Do not have sex with someone who has other sex partners.  Do not have sex with anyone you do not know or who is at high risk for an STD.  Avoid risky sex that can break your skin.  Do not have sex if you have open sores on your mouth or skin.  Avoid drinking too much alcohol or taking illegal drugs. Alcohol and drugs can affect your good judgment.  Avoid oral and anal sex acts.  Get shots (vaccines) for HPV and hepatitis.  If you are at risk of being infected with HIV, it is advised that you take a certain medicine daily to prevent HIV infection. This is called pre-exposure prophylaxis (PrEP). You may be at risk if:  You are a man who has sex with other men (MSM).  You are attracted to the opposite sex (heterosexual) and are having sex with more than one partner.  You take drugs with a needle.  You have sex with someone who has HIV.  Talk with your doctor about if you are at high risk of being infected with HIV. If you begin to take PrEP, get tested for HIV first. Get tested every 3 months for as long as you are taking PrEP. WHAT SHOULD I DO IF I THINK I HAVE AN STD?  See your doctor.  Tell your sex partner(s) that you have an STD. They should be tested and treated.  Do not have sex until your doctor says it is okay. WHEN SHOULD I GET HELP? Get help right away if:  You have bad belly (abdominal)  pain.  You are a man and have puffiness (swelling) or pain in your testicles.  You are a woman and have puffiness in your vagina. Document Released: 01/28/2004 Document Revised: 12/25/2012 Document Reviewed: 06/15/2012 [employer] Patient Information 2015 [employer]. This information is not intended to replace advice given to you by your health care provider. Make sure you discuss any questions you have with your health care provider.  

## 2013-07-18 LAB — GC/CHLAMYDIA PROBE AMP
CT PROBE, AMP APTIMA: NEGATIVE
GC Probe RNA: NEGATIVE

## 2013-08-07 ENCOUNTER — Encounter (HOSPITAL_COMMUNITY): Payer: Self-pay | Admitting: Emergency Medicine

## 2013-08-07 ENCOUNTER — Emergency Department (HOSPITAL_COMMUNITY): Payer: Medicare Other

## 2013-08-07 ENCOUNTER — Emergency Department (HOSPITAL_COMMUNITY)
Admission: EM | Admit: 2013-08-07 | Discharge: 2013-08-07 | Disposition: A | Discharge status: Home / Self Care | Payer: Medicare Other | Attending: Emergency Medicine | Admitting: Emergency Medicine

## 2013-08-07 DIAGNOSIS — M25579 Pain in unspecified ankle and joints of unspecified foot: Secondary | ICD-10-CM | POA: Insufficient documentation

## 2013-08-07 DIAGNOSIS — H5440 Blindness, one eye, unspecified eye: Secondary | ICD-10-CM | POA: Insufficient documentation

## 2013-08-07 DIAGNOSIS — L539 Erythematous condition, unspecified: Secondary | ICD-10-CM | POA: Insufficient documentation

## 2013-08-07 DIAGNOSIS — M109 Gout, unspecified: Secondary | ICD-10-CM | POA: Diagnosis not present

## 2013-08-07 DIAGNOSIS — G8929 Other chronic pain: Secondary | ICD-10-CM | POA: Insufficient documentation

## 2013-08-07 DIAGNOSIS — F17200 Nicotine dependence, unspecified, uncomplicated: Secondary | ICD-10-CM | POA: Diagnosis not present

## 2013-08-07 DIAGNOSIS — Z8739 Personal history of other diseases of the musculoskeletal system and connective tissue: Secondary | ICD-10-CM | POA: Insufficient documentation

## 2013-08-07 DIAGNOSIS — Z880 Allergy status to penicillin: Secondary | ICD-10-CM | POA: Diagnosis not present

## 2013-08-07 LAB — I-STAT CHEM 8, ED
BUN: 14 mg/dL (ref 6–23)
CHLORIDE: 105 meq/L (ref 96–112)
CREATININE: 1.2 mg/dL (ref 0.50–1.35)
Calcium, Ion: 1.23 mmol/L (ref 1.12–1.23)
GLUCOSE: 106 mg/dL — AB (ref 70–99)
HCT: 43 % (ref 39.0–52.0)
Hemoglobin: 14.6 g/dL (ref 13.0–17.0)
Potassium: 3.8 mEq/L (ref 3.7–5.3)
Sodium: 143 mEq/L (ref 137–147)
TCO2: 24 mmol/L (ref 0–100)

## 2013-08-07 LAB — URIC ACID: Uric Acid, Serum: 6.2 mg/dL (ref 4.0–7.8)

## 2013-08-07 MED ORDER — INDOMETHACIN 25 MG PO CAPS: 25.0000 mg | ORAL_CAPSULE | Freq: Three times a day (TID) | ORAL | Status: DC | PRN

## 2013-08-07 MED ORDER — HYDROCODONE-ACETAMINOPHEN 5-325 MG PO TABS: 1.0000 | ORAL_TABLET | ORAL | Status: DC | PRN

## 2013-08-07 NOTE — ED Notes (Signed)
Pt reports swelling to Lt ankle and pain in Lt ankle.

## 2013-08-07 NOTE — Discharge Instructions (Signed)
1. Medications: indomethacin, vicodin, usual home medications 2. Treatment: rest, drink plenty of fluids,  3. Follow Up: Please followup with your primary doctor and or orthopedics for discussion of your diagnoses and further evaluation after today's visit; if you do not have a primary care doctor use the resource guide provided to find one;     Gout Gout is an inflammatory arthritis caused by a buildup of uric acid crystals in the joints. Uric acid is a chemical that is normally present in the blood. When the level of uric acid in the blood is too high it can form crystals that deposit in your joints and tissues. This causes joint redness, soreness, and swelling (inflammation). Repeat attacks are common. Over time, uric acid crystals can form into masses (tophi) near a joint, destroying bone and causing disfigurement. Gout is treatable and often preventable. CAUSES  The disease begins with elevated levels of uric acid in the blood. Uric acid is produced by your body when it breaks down a naturally found substance called purines. Certain foods you eat, such as meats and fish, contain high amounts of purines. Causes of an elevated uric acid level include:  Being passed down from parent to child (heredity).  Diseases that cause increased uric acid production (such as obesity, psoriasis, and certain cancers).  Excessive alcohol use.  Diet, especially diets rich in meat and seafood.  Medicines, including certain cancer-fighting medicines (chemotherapy), water pills (diuretics), and aspirin.  Chronic kidney disease. The kidneys are no longer able to remove uric acid well.  Problems with metabolism. Conditions strongly associated with gout include:  Obesity.  High blood pressure.  High cholesterol.  Diabetes. Not everyone with elevated uric acid levels gets gout. It is not understood why some people get gout and others do not. Surgery, joint injury, and eating too much of certain foods are  some of the factors that can lead to gout attacks. SYMPTOMS   An attack of gout comes on quickly. It causes intense pain with redness, swelling, and warmth in a joint.  Fever can occur.  Often, only one joint is involved. Certain joints are more commonly involved:  Base of the big toe.  Knee.  Ankle.  Wrist.  Finger. Without treatment, an attack usually goes away in a few days to weeks. Between attacks, you usually will not have symptoms, which is different from many other forms of arthritis. DIAGNOSIS  Your caregiver will suspect gout based on your symptoms and exam. In some cases, tests may be recommended. The tests may include:  Blood tests.  Urine tests.  X-rays.  Joint fluid exam. This exam requires a needle to remove fluid from the joint (arthrocentesis). Using a microscope, gout is confirmed when uric acid crystals are seen in the joint fluid. TREATMENT  There are two phases to gout treatment: treating the sudden onset (acute) attack and preventing attacks (prophylaxis).  Treatment of an Acute Attack.  Medicines are used. These include anti-inflammatory medicines or steroid medicines.  An injection of steroid medicine into the affected joint is sometimes necessary.  The painful joint is rested. Movement can worsen the arthritis.  You may use warm or cold treatments on painful joints, depending which works best for you.  Treatment to Prevent Attacks.  If you suffer from frequent gout attacks, your caregiver may advise preventive medicine. These medicines are started after the acute attack subsides. These medicines either help your kidneys eliminate uric acid from your body or decrease your uric acid production. You  may need to stay on these medicines for a very long time.  The early phase of treatment with preventive medicine can be associated with an increase in acute gout attacks. For this reason, during the first few months of treatment, your caregiver may also  advise you to take medicines usually used for acute gout treatment. Be sure you understand your caregiver's directions. Your caregiver may make several adjustments to your medicine dose before these medicines are effective.  Discuss dietary treatment with your caregiver or dietitian. Alcohol and drinks high in sugar and fructose and foods such as meat, poultry, and seafood can increase uric acid levels. Your caregiver or dietitian can advise you on drinks and foods that should be limited. HOME CARE INSTRUCTIONS   Do not take aspirin to relieve pain. This raises uric acid levels.  Only take over-the-counter or prescription medicines for pain, discomfort, or fever as directed by your caregiver.  Rest the joint as much as possible. When in bed, keep sheets and blankets off painful areas.  Keep the affected joint raised (elevated).  Apply warm or cold treatments to painful joints. Use of warm or cold treatments depends on which works best for you.  Use crutches if the painful joint is in your leg.  Drink enough fluids to keep your urine clear or pale yellow. This helps your body get rid of uric acid. Limit alcohol, sugary drinks, and fructose drinks.  Follow your dietary instructions. Pay careful attention to the amount of protein you eat. Your daily diet should emphasize fruits, vegetables, whole grains, and fat-free or low-fat milk products. Discuss the use of coffee, vitamin C, and cherries with your caregiver or dietitian. These may be helpful in lowering uric acid levels.  Maintain a healthy body weight. SEEK MEDICAL CARE IF:   You develop diarrhea, vomiting, or any side effects from medicines.  You do not feel better in 24 hours, or you are getting worse. SEEK IMMEDIATE MEDICAL CARE IF:   Your joint becomes suddenly more tender, and you have chills or a fever. MAKE SURE YOU:   Understand these instructions.  Will watch your condition.  Will get help right away if you are not  doing well or get worse. Document Released: 12/18/1999 Document Revised: 05/06/2013 Document Reviewed: 08/03/2011 [facility] Patient Information 2015 [place], [place]. This information is not intended to replace advice given to you by your health care provider. Make sure you discuss any questions you have with your health care provider.

## 2013-08-07 NOTE — ED Provider Notes (Signed)
CSN: [id number]     Arrival date & time 08/07/13  2052 History  This chart was scribed for Francine Graven, DO by Lowella Petties, ED Scribe. The patient was seen in room TR09C/TR09C. Patient's care was started at 10:37 PM.   Chief Complaint  Patient presents with  . Ankle Pain   Patient is a 48 y.o. male presenting with ankle pain. The history is provided by the patient and medical records. No language interpreter was used.  Ankle Pain Associated symptoms: no back pain, no fever and no neck pain    HPI Comments: Timothy Boone is a 48 y.o. male who presents to the Emergency Department complaining of intermittent, waxing and waning swelling and pain in his left ankle onset 10 days ago. He reports difficulty walking due to pain. He reports that relaxation and ibuprofen improves the pain. He denies trauma or injury. He reports taking Ibuprofen with mild relief. He denies fever, chills, nausea, and vomiting. He reports a family history of DM, but no personal history.  Pt denies Hx of gout, but does endorse a purine rich diet.    Past Medical History  Diagnosis Date  . No significant past medical history   . Disc degeneration   . Disc degeneration   . Blind right eye   . Chronic back pain   . Degenerative disc disease    Past Surgical History  Procedure Laterality Date  . Degenerative cervical disc     History reviewed. No pertinent family history. History  Substance Use Topics  . Smoking status: Current Every Day Smoker -- 1.00 packs/day    Types: Cigarettes  . Smokeless tobacco: Former Systems developer  . Alcohol Use: No    Review of Systems  Constitutional: Negative for fever and chills.  Gastrointestinal: Negative for nausea and vomiting.  Musculoskeletal: Positive for arthralgias and joint swelling. Negative for back pain, neck pain and neck stiffness.  Skin: Negative for wound.  Neurological: Negative for numbness.  Hematological: Does not bruise/bleed easily.   Psychiatric/Behavioral: The patient is not nervous/anxious.   All other systems reviewed and are negative.   Allergies  Chicken allergy; Penicillins; and Tramadol  Home Medications   Prior to Admission medications   Medication Sig Start Date End Date Taking? Authorizing Provider  ibuprofen (ADVIL,MOTRIN) 200 MG tablet Take 800 mg by mouth every 6 (six) hours as needed for moderate pain.   Yes Historical Provider, MD  HYDROcodone-acetaminophen (NORCO/VICODIN) 5-325 MG per tablet Take 1 tablet by mouth every 4 (four) hours as needed for moderate pain or severe pain. 08/07/13   Arvil Utz, PA-C  indomethacin (INDOCIN) 25 MG capsule Take 1 capsule (25 mg total) by mouth 3 (three) times daily as needed. 08/07/13   Brynlyn Dade, PA-C   Triage Vitals: BP 130/88  Pulse 73  Temp(Src) 97.8 F (36.6 C) (Oral)  Resp 16  Ht 6' (1.829 m)  Wt 214 lb (97.07 kg)  BMI 29.02 kg/m2  SpO2 100% Physical Exam  Nursing note and vitals reviewed. Constitutional: He appears well-developed and well-nourished. No distress.  HENT:  Head: Normocephalic and atraumatic.  Eyes: Conjunctivae are normal.  Neck: Normal range of motion.  Cardiovascular: Normal rate, regular rhythm, normal heart sounds and intact distal pulses.   No murmur heard. Capillary refill < 3 sec  Pulmonary/Chest: Effort normal and breath sounds normal.  Musculoskeletal: He exhibits tenderness. He exhibits no edema.  ROM: Only decreased range of motion due to swelling and pain the patient is able  to plantarflex and dorsiflex Mild erythema worse on the lateral side of the joint but tender to palpation along bilateral joint lines Increased warmth on palpation No induration of the skin  Neurological: He is alert. Coordination normal.  Sensation intact to dull and sharp Strength 5/5 including dorsiflexion and plantar flexion  Skin: Skin is warm and dry. He is not diaphoretic. There is erythema (mild).  No tenting of the skin   Psychiatric: He has a normal mood and affect.    ED Course  Procedures (including critical care time) DIAGNOSTIC STUDIES: Oxygen Saturation is 100% on room air, normal by my interpretation.    COORDINATION OF CARE: 10:42 PM-Discussed treatment plan which includes blood work and X-Ray with pt at bedside and pt agreed to plan.   Labs Review Labs Reviewed  I-STAT CHEM 8, ED - Abnormal; Notable for the following:    Glucose, Bld 106 (*)    All other components within normal limits  URIC ACID    Imaging Review Dg Ankle Complete Left  08/07/2013   CLINICAL DATA:  Left ankle pain and swelling for 2 weeks, no injury.  EXAM: LEFT ANKLE COMPLETE - 3+ VIEW  COMPARISON:  None.  FINDINGS: No fracture deformity nor dislocation. The ankle mortise appears congruent and the tibiofibular syndesmosis intact. Large plantar calcaneal spur. No destructive bony lesions. Soft tissue planes are non-suspicious.  IMPRESSION: No acute fracture deformity or dislocation.   Electronically Signed   By: Elon Alas   On: 08/07/2013 23:15     EKG Interpretation None      MDM   Final diagnoses:  Acute gout of right ankle, unspecified cause   Timothy Boone presents with left ankle pain, waxing and waning for the last several days.  Pt presents with monoarticular pain, swelling and erythema.  Pt is afebrile and stable. Imaging reviewed, no evidence of occult fracture or injury. Renal function good. Pt without known peptic ulcer disease and not receiving concurrent treatment on warfarin. Pt d/c with indomethacin (50 mg PO TID). Pt without elevation in serum uric acid, however I do believe this is a gout flare.  Pt without systemic signs of infection and he has moderate ROM in the ankle; doubt septic joint.  Discussed that pt should respond to treatment with in 24 hour of begining treatment & likely resolve in 2-3 days.  Patient is returned to the emergency department for worsening swelling or pain, high fevers  or other concerning symptoms.  Patient is to followup with his primary care physician within 3 days.  BP 122/62  Pulse 55  Temp(Src) 97.8 F (36.6 C) (Oral)  Resp 18  Ht 6' (1.829 m)  Wt 214 lb (97.07 kg)  BMI 29.02 kg/m2  SpO2 99%  I personally performed the services described in this documentation, which was scribed in my presence. The recorded information has been reviewed and is accurate.    Jarrett Soho Marly Schuld, PA-C 08/07/13 [phone]

## 2013-08-08 NOTE — ED Provider Notes (Signed)
Medical screening examination/treatment/procedure(s) were performed by non-physician practitioner and as supervising physician I was immediately available for consultation/collaboration.   EKG Interpretation None        Francine Graven, DO 08/08/13 2356

## 2014-01-23 ENCOUNTER — Other Ambulatory Visit (HOSPITAL_COMMUNITY): Payer: Self-pay | Admitting: Neurosurgery

## 2014-02-20 ENCOUNTER — Encounter (HOSPITAL_COMMUNITY): Payer: Self-pay

## 2014-02-20 ENCOUNTER — Encounter (HOSPITAL_COMMUNITY)
Admission: RE | Admit: 2014-02-20 | Discharge: 2014-02-20 | Disposition: A | Discharge status: Home / Self Care | Payer: Medicare Other | Source: Ambulatory Visit | Attending: Neurosurgery | Admitting: Neurosurgery

## 2014-02-20 ENCOUNTER — Other Ambulatory Visit (HOSPITAL_COMMUNITY): Payer: Self-pay | Admitting: *Deleted

## 2014-02-20 DIAGNOSIS — Z01818 Encounter for other preprocedural examination: Secondary | ICD-10-CM | POA: Diagnosis not present

## 2014-02-20 DIAGNOSIS — G959 Disease of spinal cord, unspecified: Secondary | ICD-10-CM | POA: Insufficient documentation

## 2014-02-20 DIAGNOSIS — M47892 Other spondylosis, cervical region: Secondary | ICD-10-CM | POA: Insufficient documentation

## 2014-02-20 LAB — CBC
HCT: 45.1 % (ref 39.0–52.0)
Hemoglobin: 15.6 g/dL (ref 13.0–17.0)
MCH: 30.8 pg (ref 26.0–34.0)
MCHC: 34.6 g/dL (ref 30.0–36.0)
MCV: 89 fL (ref 78.0–100.0)
PLATELETS: 221 10*3/uL (ref 150–400)
RBC: 5.07 MIL/uL (ref 4.22–5.81)
RDW: 13 % (ref 11.5–15.5)
WBC: 7.7 10*3/uL (ref 4.0–10.5)

## 2014-02-20 LAB — BASIC METABOLIC PANEL
Anion gap: 7 (ref 5–15)
BUN: 9 mg/dL (ref 6–23)
CO2: 26 mmol/L (ref 19–32)
CREATININE: 1.18 mg/dL (ref 0.50–1.35)
Calcium: 9.8 mg/dL (ref 8.4–10.5)
Chloride: 107 mmol/L (ref 96–112)
GFR, EST AFRICAN AMERICAN: 82 mL/min — AB (ref >90)
GFR, EST NON AFRICAN AMERICAN: 70 mL/min — AB (ref >90)
Glucose, Bld: 94 mg/dL (ref 70–99)
Potassium: 4.7 mmol/L (ref 3.5–5.1)
SODIUM: 140 mmol/L (ref 135–145)

## 2014-02-20 LAB — SURGICAL PCR SCREEN
MRSA, PCR: NEGATIVE
Staphylococcus aureus: NEGATIVE

## 2014-02-20 NOTE — Pre-Procedure Instructions (Signed)
[facility]  02/20/2014   Your procedure is scheduled on:  02/28/2014  Report to [facility] Admitting   ENTRANCE A at 5:30 AM.  Call this number if you have problems the morning of surgery: [phone]   Remember:   Do not eat food or drink liquids after midnight. on Thursday   Take these medicines the morning of surgery with A SIP OF WATER:   NOTHING   Do not wear jewelry  Do not wear lotions, powders, or perfumes. You may wear deodorant.  Men may shave face and neck.  Do not bring valuables to the hospital.  [facility] is not responsible                  for any belongings or valuables.               Contacts, dentures or bridgework may not be worn into surgery.  Leave suitcase in the car. After surgery it may be brought to your room.  For patients admitted to the hospital, discharge time is determined by your                treatment team.               Patients discharged the day of surgery will not be allowed to drive  home.  Name and phone number of your driver: with family  Special Instructions: Special Instructions: [place] - Preparing for Surgery  Before surgery, you can play an important role.  Because skin is not sterile, your skin needs to be as free of germs as possible.  You can reduce the number of germs on you skin by washing with CHG (chlorahexidine gluconate) soap before surgery.  CHG is an antiseptic cleaner which kills germs and bonds with the skin to continue killing germs even after washing.  Please DO NOT use if you have an allergy to CHG or antibacterial soaps.  If your skin becomes reddened/irritated stop using the CHG and inform your nurse when you arrive at Short Stay.  Do not shave (including legs and underarms) for at least 48 hours prior to the first CHG shower.  You may shave your face.  Please follow these instructions carefully:   1.  Shower with CHG Soap the night before surgery and the  morning of Surgery.  2.  If you choose to  wash your hair, wash your hair first as usual with your  normal shampoo.  3.  After you shampoo, rinse your hair and body thoroughly to remove the  Shampoo.  4.  Use CHG as you would any other liquid soap.  You can apply chg directly to the skin and wash gently with scrungie or a clean washcloth.  5.  Apply the CHG Soap to your body ONLY FROM THE NECK DOWN.    Do not use on open wounds or open sores.  Avoid contact with your eyes, ears, mouth and genitals (private parts).  Wash genitals (private parts)   with your normal soap.  6.  Wash thoroughly, paying special attention to the area where your surgery will be performed.  7.  Thoroughly rinse your body with warm water from the neck down.  8.  DO NOT shower/wash with your normal soap after using and rinsing off   the CHG Soap.  9.  Pat yourself dry with a clean towel.            10.  Wear clean  pajamas.            11.  Place clean sheets on your bed the night of your first shower and do not sleep with pets.  Day of Surgery  Do not apply any lotions/deodorants the morning of surgery.  Please wear clean clothes to the hospital/surgery center.   Please read over the following fact sheets that you were given: Pain Booklet, Coughing and Deep Breathing, MRSA Information and Surgical Site Infection Prevention

## 2014-02-20 NOTE — Progress Notes (Signed)
Pt. Denies  Ever seeing cardiologist, no stress test in his past or EKG. Pt. Recently started seeing [facility], Jerilynn Som for PCP

## 2014-02-27 MED ORDER — VANCOMYCIN HCL IN DEXTROSE 1-5 GM/200ML-% IV SOLN
1000.0000 mg | INTRAVENOUS | Status: AC
  Administered 2014-02-28: 1000 mg via INTRAVENOUS
  Filled 2014-02-27: qty 200

## 2014-02-27 NOTE — Anesthesia Preprocedure Evaluation (Addendum)
Anesthesia Evaluation  Patient identified by MRN, date of birth, ID band Patient awake    Reviewed: Allergy & Precautions, NPO status , Patient's Chart, lab work & pertinent test results  History of Anesthesia Complications Negative for: history of anesthetic complications  Airway Mallampati: II  TM Distance: >3 FB Neck ROM: Full    Dental no notable dental hx. (+) Dental Advisory Given, Poor Dentition, Missing, Chipped,    Pulmonary Current Smoker,  breath sounds clear to auscultation  Pulmonary exam normal       Cardiovascular negative cardio ROS  Rhythm:Regular Rate:Normal     Neuro/Psych The MRI showed multilevel disk protrusions, osteophytic ridging and uncinate spurring with spinal and foraminal stenosis. The most significant findings were at C5-6 and C6-7. At C5-6 there is mass effect on the ventral thecal sac and obliteration of the ventral CSF space with moderate foraminal stenosis bilaterally. At C6-7 there is a focal right paracentral disk protrusion with a medial component likely effecting the right C7 nerve root  negative psych ROS   GI/Hepatic negative GI ROS, Neg liver ROS,   Endo/Other  negative endocrine ROS  Renal/GU negative Renal ROS  negative genitourinary   Musculoskeletal  (+) Arthritis -, Osteoarthritis,    Abdominal   Peds negative pediatric ROS (+)  Hematology negative hematology ROS (+)   Anesthesia Other Findings   Reproductive/Obstetrics negative OB ROS                           Anesthesia Physical Anesthesia Plan  ASA: II  Anesthesia Plan: General   Post-op Pain Management:    Induction: Intravenous  Airway Management Planned: Oral ETT  Additional Equipment:   Intra-op Plan:   Post-operative Plan: Extubation in OR  Informed Consent: I have reviewed the patients History and Physical, chart, labs and discussed the procedure including the risks,  benefits and alternatives for the proposed anesthesia with the patient or authorized representative who has indicated his/her understanding and acceptance.   Dental advisory given  Plan Discussed with: CRNA  Anesthesia Plan Comments:       Anesthesia Quick Evaluation

## 2014-02-28 ENCOUNTER — Inpatient Hospital Stay (HOSPITAL_COMMUNITY): Payer: Medicare Other

## 2014-02-28 ENCOUNTER — Inpatient Hospital Stay (HOSPITAL_COMMUNITY): Payer: Medicare Other | Admitting: Vascular Surgery

## 2014-02-28 ENCOUNTER — Encounter (HOSPITAL_COMMUNITY): Payer: Self-pay | Admitting: *Deleted

## 2014-02-28 ENCOUNTER — Inpatient Hospital Stay (HOSPITAL_COMMUNITY)
Admission: RE | Admit: 2014-02-28 | Discharge: 2014-03-01 | DRG: 473 | Disposition: A | Discharge status: Home / Self Care | Payer: Medicare Other | Source: Ambulatory Visit | Attending: Neurosurgery | Admitting: Neurosurgery

## 2014-02-28 ENCOUNTER — Inpatient Hospital Stay (HOSPITAL_COMMUNITY): Payer: Medicare Other | Admitting: Anesthesiology

## 2014-02-28 ENCOUNTER — Encounter (HOSPITAL_COMMUNITY)
Admission: RE | Disposition: A | Discharge status: Home / Self Care | Payer: Self-pay | Source: Ambulatory Visit | Attending: Neurosurgery

## 2014-02-28 DIAGNOSIS — M4722 Other spondylosis with radiculopathy, cervical region: Secondary | ICD-10-CM | POA: Diagnosis present

## 2014-02-28 DIAGNOSIS — F17210 Nicotine dependence, cigarettes, uncomplicated: Secondary | ICD-10-CM | POA: Diagnosis present

## 2014-02-28 DIAGNOSIS — M4802 Spinal stenosis, cervical region: Secondary | ICD-10-CM | POA: Diagnosis present

## 2014-02-28 DIAGNOSIS — M542 Cervicalgia: Secondary | ICD-10-CM | POA: Diagnosis present

## 2014-02-28 DIAGNOSIS — M4712 Other spondylosis with myelopathy, cervical region: Secondary | ICD-10-CM | POA: Diagnosis present

## 2014-02-28 DIAGNOSIS — H5441 Blindness, right eye, normal vision left eye: Secondary | ICD-10-CM | POA: Diagnosis present

## 2014-02-28 DIAGNOSIS — Z419 Encounter for procedure for purposes other than remedying health state, unspecified: Secondary | ICD-10-CM

## 2014-02-28 HISTORY — PX: ANTERIOR CERVICAL DECOMPRESSION/DISCECTOMY FUSION 4 LEVELS: SHX5556

## 2014-02-28 LAB — CBC
HCT: 41.3 % (ref 39.0–52.0)
HEMOGLOBIN: 14.1 g/dL (ref 13.0–17.0)
MCH: 30.5 pg (ref 26.0–34.0)
MCHC: 34.1 g/dL (ref 30.0–36.0)
MCV: 89.2 fL (ref 78.0–100.0)
Platelets: 241 10*3/uL (ref 150–400)
RBC: 4.63 MIL/uL (ref 4.22–5.81)
RDW: 13.2 % (ref 11.5–15.5)
WBC: 9.8 10*3/uL (ref 4.0–10.5)

## 2014-02-28 LAB — CREATININE, SERUM
Creatinine, Ser: 1.42 mg/dL — ABNORMAL HIGH (ref 0.50–1.35)
GFR calc Af Amer: 65 mL/min — ABNORMAL LOW (ref >90)
GFR calc non Af Amer: 56 mL/min — ABNORMAL LOW (ref >90)

## 2014-02-28 SURGERY — ANTERIOR CERVICAL DECOMPRESSION/DISCECTOMY FUSION 4 LEVELS
Anesthesia: General | Site: Neck

## 2014-02-28 MED ORDER — 0.9 % SODIUM CHLORIDE (POUR BTL) OPTIME
TOPICAL | Status: DC | PRN
  Administered 2014-02-28: 1000 mL

## 2014-02-28 MED ORDER — ONDANSETRON HCL 4 MG/2ML IJ SOLN
INTRAMUSCULAR | Status: DC | PRN
  Administered 2014-02-28: 4 mg via INTRAVENOUS

## 2014-02-28 MED ORDER — MIDAZOLAM HCL 2 MG/2ML IJ SOLN
INTRAMUSCULAR | Status: AC
  Filled 2014-02-28: qty 2

## 2014-02-28 MED ORDER — PROPOFOL 10 MG/ML IV BOLUS
INTRAVENOUS | Status: AC
  Filled 2014-02-28: qty 20

## 2014-02-28 MED ORDER — LACTATED RINGERS IV SOLN
INTRAVENOUS | Status: DC | PRN
  Administered 2014-02-28 (×2): via INTRAVENOUS

## 2014-02-28 MED ORDER — PHENYLEPHRINE 40 MCG/ML (10ML) SYRINGE FOR IV PUSH (FOR BLOOD PRESSURE SUPPORT)
PREFILLED_SYRINGE | INTRAVENOUS | Status: AC
  Filled 2014-02-28: qty 10

## 2014-02-28 MED ORDER — OXYCODONE-ACETAMINOPHEN 5-325 MG PO TABS
1.0000 | ORAL_TABLET | ORAL | Status: DC | PRN
  Administered 2014-02-28 – 2014-03-01 (×3): 2 via ORAL
  Filled 2014-02-28 (×3): qty 2

## 2014-02-28 MED ORDER — GLYCOPYRROLATE 0.2 MG/ML IJ SOLN
INTRAMUSCULAR | Status: AC
  Filled 2014-02-28: qty 3

## 2014-02-28 MED ORDER — HEPARIN SODIUM (PORCINE) 5000 UNIT/ML IJ SOLN
5000.0000 [IU] | Freq: Three times a day (TID) | INTRAMUSCULAR | Status: DC
  Filled 2014-02-28 (×3): qty 1

## 2014-02-28 MED ORDER — SODIUM CHLORIDE 0.9 % IR SOLN
Status: DC | PRN
  Administered 2014-02-28: 09:00:00

## 2014-02-28 MED ORDER — SODIUM CHLORIDE 0.9 % IJ SOLN: 3.0000 mL | INTRAMUSCULAR | Status: DC | PRN

## 2014-02-28 MED ORDER — ROCURONIUM BROMIDE 50 MG/5ML IV SOLN
INTRAVENOUS | Status: AC
  Filled 2014-02-28: qty 1

## 2014-02-28 MED ORDER — SODIUM CHLORIDE 0.9 % IJ SOLN
3.0000 mL | Freq: Two times a day (BID) | INTRAMUSCULAR | Status: DC
  Administered 2014-02-28: 3 mL via INTRAVENOUS

## 2014-02-28 MED ORDER — PHENOL 1.4 % MT LIQD
1.0000 | OROMUCOSAL | Status: DC | PRN
  Administered 2014-03-01: 1 via OROMUCOSAL
  Filled 2014-02-28: qty 177

## 2014-02-28 MED ORDER — PROPOFOL 10 MG/ML IV BOLUS
INTRAVENOUS | Status: DC | PRN
  Administered 2014-02-28: 200 mg via INTRAVENOUS

## 2014-02-28 MED ORDER — LIDOCAINE HCL (CARDIAC) 20 MG/ML IV SOLN
INTRAVENOUS | Status: DC | PRN
  Administered 2014-02-28: 40 mg via INTRAVENOUS

## 2014-02-28 MED ORDER — MENTHOL 3 MG MT LOZG
1.0000 | LOZENGE | OROMUCOSAL | Status: DC | PRN
  Filled 2014-02-28: qty 9

## 2014-02-28 MED ORDER — ONDANSETRON HCL 4 MG/2ML IJ SOLN: 4.0000 mg | INTRAMUSCULAR | Status: DC | PRN

## 2014-02-28 MED ORDER — ROCURONIUM BROMIDE 100 MG/10ML IV SOLN
INTRAVENOUS | Status: DC | PRN
  Administered 2014-02-28 (×4): 10 mg via INTRAVENOUS
  Administered 2014-02-28: 50 mg via INTRAVENOUS
  Administered 2014-02-28 (×2): 10 mg via INTRAVENOUS

## 2014-02-28 MED ORDER — ARTIFICIAL TEARS OP OINT
TOPICAL_OINTMENT | OPHTHALMIC | Status: DC | PRN
  Administered 2014-02-28: 1 via OPHTHALMIC

## 2014-02-28 MED ORDER — FENTANYL CITRATE 0.05 MG/ML IJ SOLN
25.0000 ug | INTRAMUSCULAR | Status: DC | PRN
  Administered 2014-02-28 (×2): 50 ug via INTRAVENOUS

## 2014-02-28 MED ORDER — THROMBIN 20000 UNITS EX SOLR
CUTANEOUS | Status: DC | PRN
  Administered 2014-02-28: 09:00:00 via TOPICAL

## 2014-02-28 MED ORDER — THROMBIN 5000 UNITS EX SOLR
OROMUCOSAL | Status: DC | PRN
  Administered 2014-02-28: 09:00:00 via TOPICAL

## 2014-02-28 MED ORDER — GABAPENTIN 600 MG PO TABS
600.0000 mg | ORAL_TABLET | Freq: Every day | ORAL | Status: DC
Start: 2014-02-28 — End: 2014-03-01
  Administered 2014-02-28: 600 mg via ORAL
  Filled 2014-02-28 (×2): qty 1

## 2014-02-28 MED ORDER — FENTANYL CITRATE 0.05 MG/ML IJ SOLN
INTRAMUSCULAR | Status: AC
  Filled 2014-02-28: qty 5

## 2014-02-28 MED ORDER — GABAPENTIN 300 MG PO CAPS
600.0000 mg | ORAL_CAPSULE | Freq: Every day | ORAL | Status: DC
  Filled 2014-02-28: qty 2

## 2014-02-28 MED ORDER — GLYCOPYRROLATE 0.2 MG/ML IJ SOLN
INTRAMUSCULAR | Status: DC | PRN
  Administered 2014-02-28: .7 mg via INTRAVENOUS

## 2014-02-28 MED ORDER — LIDOCAINE-EPINEPHRINE 1 %-1:100000 IJ SOLN
INTRAMUSCULAR | Status: DC | PRN
  Administered 2014-02-28: 4 mL

## 2014-02-28 MED ORDER — MIDAZOLAM HCL 5 MG/5ML IJ SOLN
INTRAMUSCULAR | Status: DC | PRN
  Administered 2014-02-28 (×2): 1 mg via INTRAVENOUS

## 2014-02-28 MED ORDER — DEXAMETHASONE SODIUM PHOSPHATE 10 MG/ML IJ SOLN
INTRAMUSCULAR | Status: DC | PRN
  Administered 2014-02-28: 10 mg via INTRAVENOUS

## 2014-02-28 MED ORDER — ARTIFICIAL TEARS OP OINT
TOPICAL_OINTMENT | OPHTHALMIC | Status: AC
  Filled 2014-02-28: qty 3.5

## 2014-02-28 MED ORDER — ONDANSETRON HCL 4 MG/2ML IJ SOLN: 4.0000 mg | Freq: Once | INTRAMUSCULAR | Status: DC | PRN

## 2014-02-28 MED ORDER — FENTANYL CITRATE 0.05 MG/ML IJ SOLN
INTRAMUSCULAR | Status: AC
  Administered 2014-02-28: 50 ug via INTRAVENOUS
  Filled 2014-02-28: qty 2

## 2014-02-28 MED ORDER — LIDOCAINE HCL 4 % MT SOLN
OROMUCOSAL | Status: DC | PRN
Start: 2014-02-28 — End: 2014-02-28
  Administered 2014-02-28: 4 mL via TOPICAL

## 2014-02-28 MED ORDER — ACETAMINOPHEN 10 MG/ML IV SOLN
INTRAVENOUS | Status: AC
Start: 2014-02-28 — End: 2014-02-28
  Administered 2014-02-28: 1000 mg via INTRAVENOUS
  Filled 2014-02-28: qty 100

## 2014-02-28 MED ORDER — LIDOCAINE HCL (CARDIAC) 20 MG/ML IV SOLN
INTRAVENOUS | Status: AC
  Filled 2014-02-28: qty 5

## 2014-02-28 MED ORDER — MORPHINE SULFATE 2 MG/ML IJ SOLN: 1.0000 mg | INTRAMUSCULAR | Status: DC | PRN

## 2014-02-28 MED ORDER — ACETAMINOPHEN 325 MG PO TABS: 650.0000 mg | ORAL_TABLET | ORAL | Status: DC | PRN

## 2014-02-28 MED ORDER — NEOSTIGMINE METHYLSULFATE 10 MG/10ML IV SOLN
INTRAVENOUS | Status: AC
  Filled 2014-02-28: qty 1

## 2014-02-28 MED ORDER — DIAZEPAM 5 MG PO TABS
ORAL_TABLET | ORAL | Status: AC
  Filled 2014-02-28: qty 1

## 2014-02-28 MED ORDER — BUPIVACAINE HCL 0.5 % IJ SOLN
INTRAMUSCULAR | Status: DC | PRN
  Administered 2014-02-28: 4 mL

## 2014-02-28 MED ORDER — ONDANSETRON HCL 4 MG/2ML IJ SOLN
INTRAMUSCULAR | Status: AC
  Filled 2014-02-28: qty 2

## 2014-02-28 MED ORDER — CYCLOBENZAPRINE HCL 10 MG PO TABS: 10.0000 mg | ORAL_TABLET | Freq: Three times a day (TID) | ORAL | Status: DC | PRN

## 2014-02-28 MED ORDER — NEOSTIGMINE METHYLSULFATE 10 MG/10ML IV SOLN
INTRAVENOUS | Status: DC | PRN
  Administered 2014-02-28: 4 mg via INTRAVENOUS

## 2014-02-28 MED ORDER — SODIUM CHLORIDE 0.9 % IV SOLN: INTRAVENOUS | Status: DC

## 2014-02-28 MED ORDER — DIAZEPAM 5 MG PO TABS
5.0000 mg | ORAL_TABLET | Freq: Four times a day (QID) | ORAL | Status: DC | PRN
  Administered 2014-02-28 – 2014-03-01 (×3): 5 mg via ORAL
  Filled 2014-02-28 (×2): qty 1

## 2014-02-28 MED ORDER — HEPARIN SODIUM (PORCINE) 5000 UNIT/ML IJ SOLN
5000.0000 [IU] | Freq: Three times a day (TID) | INTRAMUSCULAR | Status: DC
  Administered 2014-03-01: 5000 [IU] via SUBCUTANEOUS
  Filled 2014-02-28 (×4): qty 1

## 2014-02-28 MED ORDER — DOCUSATE SODIUM 100 MG PO CAPS
100.0000 mg | ORAL_CAPSULE | Freq: Two times a day (BID) | ORAL | Status: DC
  Administered 2014-02-28 – 2014-03-01 (×2): 100 mg via ORAL
  Filled 2014-02-28 (×2): qty 1

## 2014-02-28 MED ORDER — ACETAMINOPHEN 650 MG RE SUPP: 650.0000 mg | RECTAL | Status: DC | PRN

## 2014-02-28 MED ORDER — SENNA 8.6 MG PO TABS
1.0000 | ORAL_TABLET | Freq: Two times a day (BID) | ORAL | Status: DC
  Administered 2014-02-28: 8.6 mg via ORAL
  Filled 2014-02-28 (×4): qty 1

## 2014-02-28 MED ORDER — PHENYLEPHRINE HCL 10 MG/ML IJ SOLN
INTRAMUSCULAR | Status: DC | PRN
  Administered 2014-02-28: 80 ug via INTRAVENOUS

## 2014-02-28 MED ORDER — FENTANYL CITRATE 0.05 MG/ML IJ SOLN
INTRAMUSCULAR | Status: DC | PRN
  Administered 2014-02-28 (×5): 50 ug via INTRAVENOUS

## 2014-02-28 MED ORDER — VANCOMYCIN HCL 10 G IV SOLR
1250.0000 mg | Freq: Once | INTRAVENOUS | Status: AC
  Administered 2014-02-28: 1250 mg via INTRAVENOUS
  Filled 2014-02-28: qty 1250

## 2014-02-28 SURGICAL SUPPLY — 74 items
BAG DECANTER FOR FLEXI CONT (MISCELLANEOUS) ×3 IMPLANT
BENZOIN TINCTURE PRP APPL 2/3 (GAUZE/BANDAGES/DRESSINGS) IMPLANT
BLADE CLIPPER SURG (BLADE) IMPLANT
BLADE SURG 11 STRL SS (BLADE) ×3 IMPLANT
BLADE ULTRA TIP 2M (BLADE) ×3 IMPLANT
BUR MATCHSTICK NEURO 3.0 LAGG (BURR) ×3 IMPLANT
CAGE EXPANSE 8X6X6 (Cage) ×12 IMPLANT
CAGE PEEK 6X14X11 (Cage) ×6 IMPLANT
CAGE PEEK 7X14X11 (Cage) ×4 IMPLANT
CAGE SPNL 11X14X7XRADOPQ (Cage) ×2 IMPLANT
CANISTER SUCT 3000ML PPV (MISCELLANEOUS) ×3 IMPLANT
CLOSURE WOUND 1/2 X4 (GAUZE/BANDAGES/DRESSINGS)
CONT SPEC 4OZ CLIKSEAL STRL BL (MISCELLANEOUS) ×6 IMPLANT
DECANTER SPIKE VIAL GLASS SM (MISCELLANEOUS) ×3 IMPLANT
DRAIN CHANNEL 10M FLAT 3/4 FLT (DRAIN) IMPLANT
DRAPE C-ARM 42X72 X-RAY (DRAPES) ×6 IMPLANT
DRAPE LAPAROTOMY 100X72 PEDS (DRAPES) ×3 IMPLANT
DRAPE MICROSCOPE LEICA (MISCELLANEOUS) ×3 IMPLANT
DRAPE POUCH INSTRU U-SHP 10X18 (DRAPES) ×3 IMPLANT
DRAPE PROXIMA HALF (DRAPES) ×3 IMPLANT
DRSG OPSITE POSTOP 3X4 (GAUZE/BANDAGES/DRESSINGS) ×3 IMPLANT
DRSG OPSITE POSTOP 4X6 (GAUZE/BANDAGES/DRESSINGS) ×3 IMPLANT
DRSG TEGADERM 4X4.75 (GAUZE/BANDAGES/DRESSINGS) IMPLANT
DURAPREP 6ML APPLICATOR 50/CS (WOUND CARE) ×3 IMPLANT
ELECT COATED BLADE 2.86 ST (ELECTRODE) ×3 IMPLANT
ELECT REM PT RETURN 9FT ADLT (ELECTROSURGICAL) ×3
ELECTRODE REM PT RTRN 9FT ADLT (ELECTROSURGICAL) ×1 IMPLANT
EVACUATOR SILICONE 100CC (DRAIN) IMPLANT
GAUZE SPONGE 4X4 16PLY XRAY LF (GAUZE/BANDAGES/DRESSINGS) IMPLANT
GLOVE BIO SURGEON STRL SZ7 (GLOVE) ×6 IMPLANT
GLOVE BIO SURGEON STRL SZ8 (GLOVE) ×3 IMPLANT
GLOVE BIOGEL PI IND STRL 7.5 (GLOVE) ×4 IMPLANT
GLOVE BIOGEL PI IND STRL 8.5 (GLOVE) ×1 IMPLANT
GLOVE BIOGEL PI INDICATOR 7.5 (GLOVE) ×8
GLOVE BIOGEL PI INDICATOR 8.5 (GLOVE) ×2
GLOVE ECLIPSE 7.0 STRL STRAW (GLOVE) ×3 IMPLANT
GLOVE EXAM NITRILE LRG STRL (GLOVE) IMPLANT
GLOVE EXAM NITRILE MD LF STRL (GLOVE) IMPLANT
GLOVE EXAM NITRILE XL STR (GLOVE) IMPLANT
GLOVE EXAM NITRILE XS STR PU (GLOVE) IMPLANT
GLOVE SURG SS PI 7.0 STRL IVOR (GLOVE) ×12 IMPLANT
GOWN STRL REUS W/ TWL LRG LVL3 (GOWN DISPOSABLE) ×3 IMPLANT
GOWN STRL REUS W/ TWL XL LVL3 (GOWN DISPOSABLE) ×1 IMPLANT
GOWN STRL REUS W/TWL 2XL LVL3 (GOWN DISPOSABLE) IMPLANT
GOWN STRL REUS W/TWL LRG LVL3 (GOWN DISPOSABLE) ×6
GOWN STRL REUS W/TWL XL LVL3 (GOWN DISPOSABLE) ×2
HEMOSTAT POWDER KIT SURGIFOAM (HEMOSTASIS) ×3 IMPLANT
KIT BASIN OR (CUSTOM PROCEDURE TRAY) ×3 IMPLANT
KIT ROOM TURNOVER OR (KITS) ×3 IMPLANT
LIQUID BAND (GAUZE/BANDAGES/DRESSINGS) ×3 IMPLANT
NEEDLE HYPO 25X1 1.5 SAFETY (NEEDLE) ×3 IMPLANT
NEEDLE SPNL 22GX3.5 QUINCKE BK (NEEDLE) ×3 IMPLANT
NS IRRIG 1000ML POUR BTL (IV SOLUTION) ×3 IMPLANT
PACK LAMINECTOMY NEURO (CUSTOM PROCEDURE TRAY) ×3 IMPLANT
PAD ARMBOARD 7.5X6 YLW CONV (MISCELLANEOUS) ×9 IMPLANT
PLATE 4 80XLCK NS SPNE CVD (Plate) ×1 IMPLANT
PLATE 4 ATLANTIS TRANS (Plate) ×2 IMPLANT
RUBBERBAND STERILE (MISCELLANEOUS) ×6 IMPLANT
SCREW 4.0X13 (Screw) ×8 IMPLANT
SCREW BN 13X4XSLF DRL FXANG (Screw) ×4 IMPLANT
SCREW RESCUE 13MM (Screw) ×18 IMPLANT
SPONGE INTESTINAL PEANUT (DISPOSABLE) ×3 IMPLANT
SPONGE SURGIFOAM ABS GEL 100 (HEMOSTASIS) ×3 IMPLANT
STRIP CLOSURE SKIN 1/2X4 (GAUZE/BANDAGES/DRESSINGS) IMPLANT
SUT ETHILON 3 0 FSL (SUTURE) IMPLANT
SUT VIC AB 3-0 SH 8-18 (SUTURE) ×6 IMPLANT
SUT VICRYL 3-0 RB1 18 ABS (SUTURE) ×6 IMPLANT
SYR 20ML ECCENTRIC (SYRINGE) ×3 IMPLANT
TAPE CLOTH 2X10 TAN LF (GAUZE/BANDAGES/DRESSINGS) IMPLANT
TAPE CLOTH 4X10 WHT NS (GAUZE/BANDAGES/DRESSINGS) ×3 IMPLANT
TOWEL OR 17X24 6PK STRL BLUE (TOWEL DISPOSABLE) ×3 IMPLANT
TOWEL OR 17X26 10 PK STRL BLUE (TOWEL DISPOSABLE) ×3 IMPLANT
TRAY FOLEY CATH 16FRSI W/METER (SET/KITS/TRAYS/PACK) ×3 IMPLANT
WATER STERILE IRR 1000ML POUR (IV SOLUTION) ×3 IMPLANT

## 2014-02-28 NOTE — Progress Notes (Signed)
ANTIBIOTIC CONSULT NOTE - INITIAL  Pharmacy Consult for Vancomycin Indication: Surgical prophylaxis  Allergies  Allergen Reactions  . Chicken Allergy Itching and Other (See Comments)    Itching and burning on hands and feet.  . Demerol [Meperidine]     Hallucinations   . Penicillins Itching  . Tramadol Other (See Comments)    Hallucinations     Patient Measurements: Height: 6' (182.9 cm) Weight: 217 lb (98.431 kg) IBW/kg (Calculated) : 77.6  Vital Signs: Temp: 99.1 F (37.3 C) (02/26 1430) Temp Source: Oral (02/26 0603) BP: 135/90 mmHg (02/26 1430) Pulse Rate: 80 (02/26 1430) Intake/Output from previous day:   Intake/Output from this shift: Total I/O In: 2000 [I.V.:2000] Out: 315 [Urine:315]  Labs: No results for input(s): WBC, HGB, PLT, LABCREA, CREATININE in the last 72 hours. Estimated Creatinine Clearance: 91 mL/min (by C-G formula based on Cr of 1.18). No results for input(s): VANCOTROUGH, VANCOPEAK, VANCORANDOM, GENTTROUGH, GENTPEAK, GENTRANDOM, TOBRATROUGH, TOBRAPEAK, TOBRARND, AMIKACINPEAK, AMIKACINTROU, AMIKACIN in the last 72 hours.   Microbiology: Recent Results (from the past 720 hour(s))  Surgical pcr screen     Status: None   Collection Time: 02/20/14  9:59 AM  Result Value Ref Range Status   MRSA, PCR NEGATIVE NEGATIVE Final   Staphylococcus aureus NEGATIVE NEGATIVE Final    Comment:        The Xpert SA Assay (FDA approved for NASAL specimens in patients over 37 years of age), is one component of a comprehensive surveillance program.  Test performance has been validated by [facility] for patients greater than or equal to 32 year old. It is not intended to diagnose infection nor to guide or monitor treatment.     Medical History: Past Medical History  Diagnosis Date  . No significant past medical history   . Disc degeneration   . Disc degeneration   . Blind right eye   . Chronic back pain   . Degenerative disc disease      Assessment:  49 YO Boone s/p spinal surgery , on vancomycin for surgical prophylaxis d/t penicillin allergy (itching), pharmacy to dose post-op vancomycin. Scr 1.18 on 2/18, est. crcl ~ 90 ml/min. Received pre-op dose 1g vancomycin at 0730. No drain, confirmed with RN. Afebrile.  Plan: Vancomycin 1250mg  IV at 2000. Pharmacy sign off.  Thanks.   Maryanna Shape, PharmD, BCPS  Clinical Pharmacist  Pager: [phone]   02/28/2014,2:43 PM

## 2014-02-28 NOTE — H&P (Signed)
CC:  Neck and right arm pain  HPI: Timothy Boone is a 49 y.o. male seen for initial consultation in the office. He comes in with several year history of slowly but progressively worsening primarily neck pain, and right hand numbness. He says symptoms started several years ago, and actually did undergo MRI of the cervical spine back in 2007. At that time, multi level cervical spondylosis was noted, and he says that he was told he needed surgery, however he's been "putting it off." More recently, he has developed progressive worsening of his neck pain, and says that he woke up one day a few months ago and his neck was so stiff he couldn't turn it. He therefore went to the emergency department. His neck stiffness did improve over the course of a few days. He has also been having fairly constant right hand numbness involving all 5 digits, as well as intermittent left hand numbness. He says he has also noticed occasional problems with his balance, although he has not yet had a fall. He has not had any bladder changes. He has not noted any weakness of the arms or legs. He has been taking multiple different medications, and is currently maintained on gabapentin, meloxicam, and cyclobenzaprine. These provide some relief, and primarily help him to sleep at night.   PMH: Past Medical History  Diagnosis Date  . No significant past medical history   . Disc degeneration   . Disc degeneration   . Blind right eye   . Chronic back pain   . Degenerative disc disease     PSH: Past Surgical History  Procedure Laterality Date  . Degenerative cervical disc    . Fatty tissue Right > 15 yrs. ago     cyst removed on the jaw, at the same time he had a tooth extracted , given heavy sedation, pt. thinks it ws done at surgery center, no anesth. complications that pt. is aware of    SH: History  Substance Use Topics  . Smoking status: Current Every Day Smoker -- 0.50 packs/day for 36 years    Types: Cigarettes   . Smokeless tobacco: Former Systems developer  . Alcohol Use: Yes     Comment: not regular, partner states "3x's yr."    MEDS: Prior to Admission medications   Medication Sig Start Date End Date Taking? Authorizing Provider  calcium carbonate (TUMS EX) 750 MG chewable tablet Chew 2 tablets by mouth daily as needed for heartburn.   Yes Historical Provider, MD  gabapentin (NEURONTIN) 300 MG capsule Take 600 mg by mouth at bedtime.   Yes Historical Provider, MD  ibuprofen (ADVIL,MOTRIN) 200 MG tablet Take 600 mg by mouth every 6 (six) hours as needed for moderate pain.    Yes Historical Provider, MD  indomethacin (INDOCIN) 25 MG capsule Take 1 capsule (25 mg total) by mouth 3 (three) times daily as needed. 08/07/13  Yes Hannah Muthersbaugh, PA-C  cyclobenzaprine (FLEXERIL) 10 MG tablet Take 10 mg by mouth 3 (three) times daily as needed for muscle spasms.    Historical Provider, MD  HYDROcodone-acetaminophen (NORCO/VICODIN) 5-325 MG per tablet Take 1 tablet by mouth every 4 (four) hours as needed for moderate pain or severe pain. Patient not taking: Reported on 02/18/2014 08/07/13   Jarrett Soho Muthersbaugh, PA-C  meloxicam (MOBIC) 7.5 MG tablet Take 7.5 mg by mouth 2 (two) times daily as needed.     Historical Provider, MD    ALLERGY: Allergies  Allergen Reactions  . Chicken Allergy  Itching and Other (See Comments)    Itching and burning on hands and feet.  . Demerol [Meperidine]     Hallucinations   . Penicillins Itching  . Tramadol Other (See Comments)    Hallucinations     ROS: ROS  NEUROLOGIC EXAM: Awake, alert, oriented Memory and concentration grossly intact Speech fluent, appropriate CN grossly intact Motor exam: Upper Extremities Deltoid Bicep Tricep Grip  Right 5/5 5/5 5/5 5/5  Left 5/5 5/5 5/5 5/5   Lower Extremity IP Quad PF DF EHL  Right 5/5 5/5 5/5 5/5 5/5  Left 5/5 5/5 5/5 5/5 5/5   Sensation grossly intact to LT  IMGAING: MRI of the cervical spine was reviewed. This  demonstrates normal cervical lordosis. At C3 C4 there is focal central disc bulge with effacement of the ventral CSF space and radiographic cord compression. No significant foraminal stenosis is seen at this level. At C4 C5, there is fairly broad-based disc bulge causing effacement of the ventral CSF space, and cord compression. No significant foraminal stenosis. At C5 C6, there is broad-based disc bulge with spinal cord compression, and bilateral moderately severe foraminal stenosis. At C6 C7, there is right eccentric disc bulge with likely compression of the exiting right C7 nerve root.  IMPRESSION: 49 year old man with neck pain and arm numbness, and by history, some vague symptoms concerning for myelopathy given the MRI findings demonstrating multilevel cervical spondylosis with spinal cord compression. His symptoms have progressed despite time, pain medications, and anti-inflammatories.  PLAN: Proceed with surgical decompression via ACDF at C3 C4, C4 C5, C5 C6, and C6 C7   I did review the MRI findings with the patient in the office. Given the radiographic spinal cord compression, I did offer surgical decompression. The risks and benefits of surgery, as well as the risks and benefits of nonsurgical treatment were reviewed. Of note, I did tell the patient that with his spinal cord compression, he is at increased risk of spinal cord injury with even minor trauma. I did also explain him that the primary goal of surgical decompression would be to halt progression, and relieve compression of the spinal cord. He may gain some improvement in his neck pain, and arm numbness, however this is certainly not guaranteed.   The risks of surgery were discussed in detail with the patient which include but are not limited to spinal cord injury which may result in hand, leg, and bowel dysfunction, postoperative dysphagia, dysphonia, neck hematoma, or subsequent surgery for epidural hematoma. The risk of CSF leak was  also discussed. In addition, I explained to him that after spinal fusion surgery, there is a risk of adjacent level disease requiring future surgical intervention. The possibility of nonunion and the need for additional surgery for posterior fusion was also discussed. The patient understood our discussion as well as the risks of the surgery and is willing to proceed. All questions were answered.

## 2014-02-28 NOTE — Op Note (Signed)
PREOP DIAGNOSIS: Cervical Spondylosis with radiculopathy, cervical spinal stenosis, C3-4, C4-5, C5-6, C6-7  POSTOP DIAGNOSIS: Same  PROCEDURE: 1. Discectomy at C3-4, C4-5, C5-6, C6-7 for decompression of spinal cord and exiting nerve roots  2. Placement of intervertebral biomechanical device  C3-4: Medtronic PEEK 49mm  C4-5: Medtronic PEEK 26mm  C5-6: Medtronic PEEK 73mm  C6-7: Medtronic PEEK 57mm 3. Placement of anterior instrumentation consisting of interbody plate and screws spanning C3-C7 4. Use of bone allograft  5. Arthrodesis C3-4, C4-5, C5-6, C6-7, anterior interbody technique  6. Use of intraoperative microscope  SURGEON: Dr. Consuella Lose, MD  ASSISTANT: Dr. Erline Levine, MD  ANESTHESIA: General Endotracheal  EBL: 100cc  SPECIMENS: None  DRAINS: None  COMPLICATIONS: None immediate  CONDITION: Hemodynamically stable to PACU  HISTORY: Timothy Boone is a 49 y.o. man initially seen in the outpatient clinic with neck and primarily right arm pain and numbness. MRI demonstrated multilevel cervical spondylosis, with spinal cord compression and cervical stenosis at C3-4, C4-5, and C5-C6, with right-sided moderately severe foraminal stenosis at C6-7. With these findings, treatment options and risks and benefits of each, were discussed including surgical decompression. After all questions were answered, the patient elected to proceed with surgery. Informed consent was therefore obtained.  PROCEDURE IN DETAIL: The patient was brought to the operating room and transferred to the operative table. After induction of general anesthesia, the patient was positioned on the operative table in the supine position with all pressure points meticulously padded. The skin of the neck was then prepped and draped in the usual sterile fashion.  After timeout was conducted, the skin was infiltrated with local anesthetic. Skin incision was then made sharply and Bovie electrocautery was used to  dissect the subcutaneous tissue until the platysma was identified. The platysma was then divided and undermined. The sternocleidomastoid muscle was then identified and, utilizing natural fascial planes in the neck, the prevertebral fascia was identified and the carotid sheath was retracted laterally and the trachea and esophagus retracted medially. Again using fluoroscopy, the correct disc spaces were identified. Bovie electrocautery was used to dissect in the subperiosteal plane and elevate the bilateral longus coli muscles. Table mounted retractors were then placed. At this point, the microscope was draped and brought into the field, and the remainder of the case was done under the microscope using microdissecting technique.  The C3-4 disc space was incised sharply and rongeurs were use to initially complete a discectomy. The high-speed drill was then used to complete discectomy until the posterior annulus was identified and removed and the posterior longitudinal ligament was identified. Using a nerve hook, the PLL was elevated, and Kerrison rongeurs were used to remove the posterior longitudinal ligament and the ventral thecal sac was identified. Using a combination of curettes and rongeurs, complete decompression of the thecal sac and exiting nerve roots at this level was completed, and verified using micro-nerve hook.  At this point, the above interbody cage was sized and packed with bone allograft. This was then inserted and tapped into place.  Attention was then turned to the C4-5 level. In a similar fashion, discectomy was completed initially with curettes and rongeurs, and completed with the drill. The PLL was again identified, elevated and incised. Using Kerrison rongeurs, decompression of the spinal cord and exiting roots at this level was completed and confirmed with a dissector.  A 73mm interbody cage was then sized and filled with bone allograft, and tapped into place.   Attention was then  turned to the  C5-6 level. In a similar fashion, discectomy was completed initially with curettes and rongeurs, and completed with the drill. The PLL was again identified, elevated and incised. Using Kerrison rongeurs, decompression of the spinal cord and exiting C6 rootsl was completed and confirmed with a dissector.  A 22mm interbody cage was then sized and filled with bone allograft, and tapped into place.   Finally, attention was then turned to the C6-7 level. In a similar fashion, discectomy was completed initially with curettes and rongeurs, and completed with the drill. The PLL was again identified, elevated and incised. Using Kerrison rongeurs, decompression of the right C6-7 foramen and right C7 nerve root was completed and confirmed with a dissector.  A 7mm interbody cage was then sized and filled with bone allograft, and tapped into place.   After placement of the intervertebral devices, the anterior cervical plate was selected, and placed across the interspaces. Using a high-speed drill, the cortex of the cervical vertebral bodies was punctured, and screws inserted in the C3, C4, C5, C6, and C7 levels. Final fluoroscopic images in AP and lateral projections were taken to confirm good hardware placement.  At this point, after all counts were verified to be correct, meticulous hemostasis was secured using a combination of bipolar electrocautery and passive hemostatics. The platysma muscle was then closed using interrupted 3-0 Vicryl sutures, and the skin was closed with a interrupted subcuticular stitch. Sterile dressings were then applied and the drapes removed.  The patient tolerated the procedure well and was extubated in the room and taken to the postanesthesia care unit in stable condition.

## 2014-02-28 NOTE — Anesthesia Procedure Notes (Signed)
Procedure Name: Intubation Date/Time: 02/28/2014 7:41 AM Performed by: Susa Loffler Pre-anesthesia Checklist: Patient identified, Timeout performed, Emergency Drugs available, Suction available and Patient being monitored Patient Re-evaluated:Patient Re-evaluated prior to inductionOxygen Delivery Method: Circle system utilized Preoxygenation: Pre-oxygenation with 100% oxygen Intubation Type: IV induction Ventilation: Mask ventilation without difficulty Laryngoscope Size: Mac and 3 Grade View: Grade I Tube type: Oral Tube size: 7.5 mm Number of attempts: 1 Airway Equipment and Method: Stylet and LTA kit utilized Placement Confirmation: ETT inserted through vocal cords under direct vision,  positive ETCO2 and breath sounds checked- equal and bilateral Secured at: 22 cm Tube secured with: Tape Dental Injury: Teeth and Oropharynx as per pre-operative assessment

## 2014-02-28 NOTE — Transfer of Care (Signed)
Immediate Anesthesia Transfer of Care Note  Patient: Timothy Boone [facility]  Procedure(s) Performed: Procedure(s) with comments: Cervical three-four, Cervical four-five, Cervical five-six, Cervical six-seven anterior cervical decompression with fusion interbody prosthesis plating and bonegraft (N/A) - Cervical three-four, Cervical four-five, Cervical five-six, Cervical six-seven anterior cervical decompression with fusion interbody prosthesis plating and bonegraft  Patient Location: PACU  Anesthesia Type:General  Level of Consciousness: awake, alert  and oriented  Airway & Oxygen Therapy: Patient Spontanous Breathing and Patient connected to nasal cannula oxygen  Post-op Assessment: Report given to RN and Post -op Vital signs reviewed and stable  Post vital signs: Reviewed and stable  Last Vitals:  Filed Vitals:   02/28/14 0603  BP: 130/88  Pulse: 71  Temp: 36.7 C  Resp: 16    Complications: No apparent anesthesia complications

## 2014-02-28 NOTE — Plan of Care (Signed)
Problem: Consults Goal: Diagnosis - Spinal Surgery Outcome: Completed/Met Date Met:  02/28/14 Cervical Spine Fusion

## 2014-02-28 NOTE — Anesthesia Postprocedure Evaluation (Signed)
  Anesthesia Post-op Note  Patient: Mclean Moya [facility]  Procedure(s) Performed: Procedure(s) (LRB): Cervical three-four, Cervical four-five, Cervical five-six, Cervical six-seven anterior cervical decompression with fusion interbody prosthesis plating and bonegraft (N/A)  Patient Location: PACU  Anesthesia Type: General  Level of Consciousness: awake and alert   Airway and Oxygen Therapy: Patient Spontanous Breathing  Post-op Pain: mild  Post-op Assessment: Post-op Vital signs reviewed, Patient's Cardiovascular Status Stable, Respiratory Function Stable, Patent Airway and No signs of Nausea or vomiting  Last Vitals:  Filed Vitals:   02/28/14 1315  BP: 139/85  Pulse: 84  Temp:   Resp: 10    Post-op Vital Signs: stable   Complications: No apparent anesthesia complications

## 2014-03-01 MED ORDER — CYCLOBENZAPRINE HCL 10 MG PO TABS: 10.0000 mg | ORAL_TABLET | Freq: Three times a day (TID) | ORAL | Status: DC | PRN

## 2014-03-01 MED ORDER — OXYCODONE-ACETAMINOPHEN 5-325 MG PO TABS: 1.0000 | ORAL_TABLET | ORAL | Status: DC | PRN

## 2014-03-01 MED ORDER — INDOMETHACIN 25 MG PO CAPS: 25.0000 mg | ORAL_CAPSULE | Freq: Three times a day (TID) | ORAL | Status: DC | PRN

## 2014-03-01 NOTE — Discharge Summary (Signed)
  Physician Discharge Summary  Patient ID: Timothy Boone MRN: [id number] DOB/AGE: [birth year] 50 y.o.  Admit date: 02/28/2014 Discharge date: 03/01/2014  Admission Diagnoses: Cervical spondylosis C34, C4-5, C5-6, C6-7  Discharge Diagnoses: Same Active Problems:   Cervical spondylosis with radiculopathy   Discharged Condition: Stable  Hospital Course:  Mrs. Timothy Boone is a 49 y.o. male electively admitted after ACDF. Postoperatively, the patient was at neurologic baseline, reporting relief of his right arm pain. He was tolerating diet, ambulating independently, voiding normally, with pain controlled with oral medication.  Treatments: Surgery - ACDF C3-4, C4-5, C5-6, C6-7  Discharge Exam: Blood pressure 112/76, pulse 76, temperature 98.2 F (36.8 C), temperature source Oral, resp. rate 18, height 6' (1.829 m), weight 98.431 kg (217 lb), SpO2 98 %. Awake, alert, oriented Speech fluent, appropriate CN grossly intact 5/5 BUE/BLE Wound c/d/i  Follow-up: Follow-up in my office [facility] [phone]) in 2-3 weeks  Disposition: 01-Home or Self Care     Medication List    STOP taking these medications        HYDROcodone-acetaminophen 5-325 MG per tablet  Commonly known as:  NORCO/VICODIN     ibuprofen 200 MG tablet  Commonly known as:  ADVIL,MOTRIN      TAKE these medications        calcium carbonate 750 MG chewable tablet  Commonly known as:  TUMS EX  Chew 2 tablets by mouth daily as needed for heartburn.     cyclobenzaprine 10 MG tablet  Commonly known as:  FLEXERIL  Take 10 mg by mouth 3 (three) times daily as needed for muscle spasms.     gabapentin 300 MG capsule  Commonly known as:  NEURONTIN  Take 600 mg by mouth at bedtime.     indomethacin 25 MG capsule  Commonly known as:  INDOCIN  Take 1 capsule (25 mg total) by mouth 3 (three) times daily as needed.  Start taking on:  03/14/2014     meloxicam 7.5 MG tablet  Commonly known  as:  MOBIC  Take 7.5 mg by mouth 2 (two) times daily as needed.     oxyCODONE-acetaminophen 5-325 MG per tablet  Commonly known as:  PERCOCET/ROXICET  Take 1-2 tablets by mouth every 4 (four) hours as needed for moderate pain.         SignedConsuella Lose, C 03/01/2014, 8:50 AM

## 2014-03-01 NOTE — Progress Notes (Signed)
OT Cancellation Note  Patient Details Name: Timothy Boone MRN: [id number] DOB: 05/23/1965   Cancelled Treatment:    Reason Eval/Treat Not Completed: OT screened, no needs identified, will sign off. PT notified OT that pt has no OT needs.   Benito Mccreedy  OTR/L [phone]  03/01/2014, 9:23 AM

## 2014-03-01 NOTE — Care Management Note (Signed)
    Page 1 of 1   03/01/2014     9:51:07 AM CARE MANAGEMENT NOTE 03/01/2014  Patient:  Timothy Boone, Timothy Boone   Account Number:  [id number]  Date Initiated:  03/01/2014  Documentation initiated by:  [facility]  Subjective/Objective Assessment:   adm: Cervical spondylosis C34, C4-5, C5-6, C6-7     Action/Plan:   discharge planning   Anticipated DC Date:  03/01/2014   Anticipated DC Plan:  [address]  CM consult      Choice offered to / List presented to:             Status of service:  Completed, signed off Medicare Important Message given?   (If response is "NO", the following Medicare IM given date fields will be blank) Date Medicare IM given:   Medicare IM given by:   Date Additional Medicare IM given:   Additional Medicare IM given by:    Discharge Disposition:  HOME/SELF CARE  Per UR Regulation:    If discussed at Long Length of Stay Meetings, dates discussed:    Comments:  03/01/14 09:45 CM notes no PT/OT followup.  No other Cm needs were communicated.  Mariane Masters, BSN, CM [phone].

## 2014-03-01 NOTE — Evaluation (Signed)
Physical Therapy Evaluation Patient Details Name: RANE BLITCH MRN: [id number] DOB: [birth year] Today's Date: 03/01/2014   History of Present Illness  Timothy Boone is a 49 y.o. male seen for initial consultation in the office. He comes in with several year history of slowly but progressively worsening primarily neck pain, and right hand numbness. Pt with DDD, right eye blindness. Underwent ACDF C3-7  Clinical Impression  Pt mod I with mobility, discussed proper posture and precautions and pt able to demonstrate and verbalize. Discussed activity level upon return home as pt admits that he tends to "overdo it". No equipment or f/u PT needs.    Follow Up Recommendations No PT follow up    Equipment Recommendations  None recommended by PT    Recommendations for Other Services       Precautions / Restrictions Precautions Precautions: Cervical Precaution Comments: handout given, reviewed precautions and proper posture Required Braces or Orthoses: Cervical Brace Cervical Brace: Hard collar;At all times Restrictions Weight Bearing Restrictions: No      Mobility  Bed Mobility Overal bed mobility: Modified Independent             General bed mobility comments: practiced bed mobility with log rolling, pt able to do this safely and independently  Transfers Overall transfer level: Modified independent Equipment used: None                Ambulation/Gait Ambulation/Gait assistance: Modified independent (Device/Increase time) Ambulation Distance (Feet): 200 Feet Assistive device: None Gait Pattern/deviations: WFL(Within Functional Limits) Gait velocity: WFL Gait velocity interpretation: at or above normal speed for age/gender General Gait Details: no gait issues  Stairs            Wheelchair Mobility    Modified Rankin (Stroke Patients Only)       Balance Overall balance assessment: Modified Independent                                            Pertinent Vitals/Pain Pain Assessment: 0-10 Pain Score: 3  Pain Location: neck Pain Intervention(s): Monitored during session    Home Living Family/patient expects to be discharged to:: Private residence Living Arrangements: Spouse/significant other Available Help at Discharge: Family;Available PRN/intermittently Type of Home: House Home Access: Stairs to enter Entrance Stairs-Rails: None Entrance Stairs-Number of Steps: 2 Home Layout: One level Home Equipment: None Additional Comments: pt is an Training and development officer, 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, as well as postural considerations when he does so and slow re-entry    Prior Function Level of Independence: Independent               Hand Dominance   Dominant Hand: Right    Extremity/Trunk Assessment   Upper Extremity Assessment: RUE deficits/detail RUE Deficits / Details: tingling in right hand (was numb before surgery)         Lower Extremity Assessment: Overall WFL for tasks assessed      Cervical / Trunk Assessment: Normal  Communication   Communication: No difficulties  Cognition Arousal/Alertness: Awake/alert Behavior During Therapy: WFL for tasks assessed/performed Overall Cognitive Status: Within Functional Limits for tasks assessed                      General Comments      Exercises        Assessment/Plan    PT Assessment Patent does  not need any further PT services  PT Diagnosis Acute pain   PT Problem List    PT Treatment Interventions     PT Goals (Current goals can be found in the Care Plan section) Acute Rehab PT Goals Patient Stated Goal: return home PT Goal Formulation: All assessment and education complete, DC therapy    Frequency     Barriers to discharge        Co-evaluation               End of Session Equipment Utilized During Treatment: Cervical collar Activity Tolerance: Patient tolerated treatment well Patient left: in bed;with call  bell/phone within reach;with family/visitor present Nurse Communication: Mobility status         Time: 2694-8546 PT Time Calculation (min) (ACUTE ONLY): 21 min   Charges:   PT Evaluation $Initial PT Evaluation Tier I: 1 Procedure     PT G Codes:       Leighton Roach, PT  Acute Rehab Services  [phone]  Leighton Roach 03/01/2014, 9:13 AM

## 2014-03-01 NOTE — Discharge Instructions (Signed)
Wound Care Leave incision open to air. You may shower. Do not scrub directly on incision.  Do not put any creams, lotions, or ointments on incision. Activity Walk each and every day, increasing distance each day. No lifting greater than 5 lbs.  Avoid excessive neck motion. No driving for 2 weeks; may ride as a passenger locally. Wear neck brace when out of bed. Diet Resume your normal diet.  Return to Work Will be discussed at you follow up appointment. Call Your Doctor If Any of These Occur Redness, drainage, or swelling at the wound.  Temperature greater than 101 degrees. Severe pain not relieved by pain medication. Increased difficulty swallowing. Incision starts to come apart. Follow Up Appt Call today for appointment in 2 weeks ([phone]) or for problems.  If you have any hardware placed in your spine, you will need an x-ray before your appointment.

## 2014-03-01 NOTE — Progress Notes (Signed)
Patient alert and oriented, mae's well, voiding adequate amount of urine, swallowing without difficulty, no c/o pain. Patient discharged home with significant other. Script and discharged instructions given to patient. Patient and family stated understanding of d/c instructions given and has an appointment with MD. Tomma Rakers RN

## 2014-03-03 ENCOUNTER — Encounter (HOSPITAL_COMMUNITY): Payer: Self-pay | Admitting: Neurosurgery

## 2014-03-05 ENCOUNTER — Encounter (HOSPITAL_COMMUNITY): Payer: Self-pay | Admitting: Neurosurgery

## 2014-03-06 ENCOUNTER — Other Ambulatory Visit (HOSPITAL_COMMUNITY): Payer: Self-pay | Admitting: Neurosurgery

## 2014-03-06 ENCOUNTER — Inpatient Hospital Stay (HOSPITAL_COMMUNITY)
Admission: AD | Admit: 2014-03-06 | Discharge: 2014-03-08 | DRG: 909 | Disposition: A | Discharge status: Home / Self Care | Payer: Medicare Other | Source: Ambulatory Visit | Attending: Neurosurgery | Admitting: Neurosurgery

## 2014-03-06 ENCOUNTER — Ambulatory Visit (HOSPITAL_COMMUNITY)
Admission: RE | Admit: 2014-03-06 | Discharge: 2014-03-06 | Disposition: A | Discharge status: Home / Self Care | Payer: Medicare Other | Source: Ambulatory Visit | Attending: Neurosurgery | Admitting: Neurosurgery

## 2014-03-06 ENCOUNTER — Encounter (HOSPITAL_COMMUNITY)
Admission: AD | Disposition: A | Discharge status: Home / Self Care | Payer: Self-pay | Source: Ambulatory Visit | Attending: Neurosurgery

## 2014-03-06 ENCOUNTER — Inpatient Hospital Stay (HOSPITAL_COMMUNITY): Payer: Medicare Other | Admitting: Anesthesiology

## 2014-03-06 ENCOUNTER — Encounter (HOSPITAL_COMMUNITY): Payer: Self-pay | Admitting: *Deleted

## 2014-03-06 DIAGNOSIS — M4712 Other spondylosis with myelopathy, cervical region: Secondary | ICD-10-CM

## 2014-03-06 DIAGNOSIS — R1310 Dysphagia, unspecified: Secondary | ICD-10-CM | POA: Insufficient documentation

## 2014-03-06 DIAGNOSIS — Z79899 Other long term (current) drug therapy: Secondary | ICD-10-CM

## 2014-03-06 DIAGNOSIS — Z981 Arthrodesis status: Secondary | ICD-10-CM | POA: Diagnosis not present

## 2014-03-06 DIAGNOSIS — R221 Localized swelling, mass and lump, neck: Secondary | ICD-10-CM | POA: Insufficient documentation

## 2014-03-06 DIAGNOSIS — M96830 Postprocedural hemorrhage and hematoma of a musculoskeletal structure following a musculoskeletal system procedure: Principal | ICD-10-CM | POA: Diagnosis present

## 2014-03-06 DIAGNOSIS — H5441 Blindness, right eye, normal vision left eye: Secondary | ICD-10-CM | POA: Diagnosis present

## 2014-03-06 DIAGNOSIS — Y838 Other surgical procedures as the cause of abnormal reaction of the patient, or of later complication, without mention of misadventure at the time of the procedure: Secondary | ICD-10-CM | POA: Diagnosis present

## 2014-03-06 DIAGNOSIS — R609 Edema, unspecified: Secondary | ICD-10-CM

## 2014-03-06 DIAGNOSIS — IMO0002 Reserved for concepts with insufficient information to code with codable children: Secondary | ICD-10-CM | POA: Diagnosis present

## 2014-03-06 HISTORY — PX: ANTERIOR CERVICAL DECOMP/DISCECTOMY FUSION: SHX1161

## 2014-03-06 HISTORY — PX: ESOPHAGOSCOPY: SHX5534

## 2014-03-06 LAB — CBC
HEMATOCRIT: 41.1 % (ref 39.0–52.0)
Hemoglobin: 14.3 g/dL (ref 13.0–17.0)
MCH: 31 pg (ref 26.0–34.0)
MCHC: 34.8 g/dL (ref 30.0–36.0)
MCV: 89 fL (ref 78.0–100.0)
Platelets: 289 10*3/uL (ref 150–400)
RBC: 4.62 MIL/uL (ref 4.22–5.81)
RDW: 12.6 % (ref 11.5–15.5)
WBC: 11.6 10*3/uL — AB (ref 4.0–10.5)

## 2014-03-06 LAB — BASIC METABOLIC PANEL
Anion gap: 6 (ref 5–15)
BUN: 9 mg/dL (ref 6–23)
CALCIUM: 9.5 mg/dL (ref 8.4–10.5)
CHLORIDE: 99 mmol/L (ref 96–112)
CO2: 29 mmol/L (ref 19–32)
CREATININE: 0.99 mg/dL (ref 0.50–1.35)
GFR calc Af Amer: >90 mL/min (ref >90)
GFR calc non Af Amer: >90 mL/min (ref >90)
GLUCOSE: 109 mg/dL — AB (ref 70–99)
Potassium: 4.6 mmol/L (ref 3.5–5.1)
Sodium: 134 mmol/L — ABNORMAL LOW (ref 135–145)

## 2014-03-06 LAB — GRAM STAIN

## 2014-03-06 SURGERY — ANTERIOR CERVICAL DECOMPRESSION/DISCECTOMY FUSION 1 LEVEL
Anesthesia: General | Site: Esophagus

## 2014-03-06 MED ORDER — DOCUSATE SODIUM 100 MG PO CAPS
100.0000 mg | ORAL_CAPSULE | Freq: Two times a day (BID) | ORAL | Status: DC
  Administered 2014-03-06 – 2014-03-08 (×4): 100 mg via ORAL
  Filled 2014-03-06 (×4): qty 1

## 2014-03-06 MED ORDER — GLYCOPYRROLATE 0.2 MG/ML IJ SOLN
INTRAMUSCULAR | Status: AC
  Filled 2014-03-06: qty 2

## 2014-03-06 MED ORDER — ARTIFICIAL TEARS OP OINT
TOPICAL_OINTMENT | OPHTHALMIC | Status: AC
  Filled 2014-03-06: qty 3.5

## 2014-03-06 MED ORDER — ACETAMINOPHEN 650 MG RE SUPP: 650.0000 mg | RECTAL | Status: DC | PRN

## 2014-03-06 MED ORDER — ONDANSETRON HCL 4 MG/2ML IJ SOLN
INTRAMUSCULAR | Status: DC | PRN
Start: 2014-03-06 — End: 2014-03-06
  Administered 2014-03-06: 4 mg via INTRAVENOUS

## 2014-03-06 MED ORDER — ONDANSETRON HCL 4 MG/2ML IJ SOLN: 4.0000 mg | Freq: Four times a day (QID) | INTRAMUSCULAR | Status: DC | PRN

## 2014-03-06 MED ORDER — LIDOCAINE HCL (CARDIAC) 20 MG/ML IV SOLN
INTRAVENOUS | Status: DC | PRN
  Administered 2014-03-06: 60 mg via INTRAVENOUS

## 2014-03-06 MED ORDER — LACTATED RINGERS IV SOLN
INTRAVENOUS | Status: DC | PRN
Start: 2014-03-06 — End: 2014-03-06
  Administered 2014-03-06: 13:00:00 via INTRAVENOUS

## 2014-03-06 MED ORDER — MELOXICAM 7.5 MG PO TABS
7.5000 mg | ORAL_TABLET | Freq: Two times a day (BID) | ORAL | Status: DC | PRN
  Filled 2014-03-06: qty 1

## 2014-03-06 MED ORDER — CYCLOBENZAPRINE HCL 10 MG PO TABS
10.0000 mg | ORAL_TABLET | Freq: Three times a day (TID) | ORAL | Status: DC | PRN
Start: 2014-03-06 — End: 2014-03-08
  Administered 2014-03-07 – 2014-03-08 (×2): 10 mg via ORAL
  Filled 2014-03-06 (×3): qty 1

## 2014-03-06 MED ORDER — VANCOMYCIN HCL IN DEXTROSE 1-5 GM/200ML-% IV SOLN
INTRAVENOUS | Status: AC
Start: 2014-03-06 — End: 2014-03-06
  Administered 2014-03-06: 1000 mg via INTRAVENOUS
  Filled 2014-03-06: qty 200

## 2014-03-06 MED ORDER — FENTANYL CITRATE 0.05 MG/ML IJ SOLN
25.0000 ug | INTRAMUSCULAR | Status: DC | PRN
Start: 2014-03-06 — End: 2014-03-06
  Administered 2014-03-06 (×2): 50 ug via INTRAVENOUS

## 2014-03-06 MED ORDER — DEXAMETHASONE SODIUM PHOSPHATE 10 MG/ML IJ SOLN
INTRAMUSCULAR | Status: DC | PRN
  Administered 2014-03-06: 10 mg via INTRAVENOUS

## 2014-03-06 MED ORDER — ROCURONIUM BROMIDE 50 MG/5ML IV SOLN
INTRAVENOUS | Status: AC
  Filled 2014-03-06: qty 1

## 2014-03-06 MED ORDER — PHENOL 1.4 % MT LIQD: 1.0000 | OROMUCOSAL | Status: DC | PRN

## 2014-03-06 MED ORDER — FENTANYL CITRATE 0.05 MG/ML IJ SOLN
INTRAMUSCULAR | Status: DC | PRN
  Administered 2014-03-06 (×2): 25 ug via INTRAVENOUS
  Administered 2014-03-06 (×4): 50 ug via INTRAVENOUS

## 2014-03-06 MED ORDER — SODIUM CHLORIDE 0.9 % IJ SOLN
3.0000 mL | Freq: Two times a day (BID) | INTRAMUSCULAR | Status: DC
  Administered 2014-03-06 – 2014-03-08 (×3): 3 mL via INTRAVENOUS

## 2014-03-06 MED ORDER — SENNA 8.6 MG PO TABS
1.0000 | ORAL_TABLET | Freq: Two times a day (BID) | ORAL | Status: DC
  Administered 2014-03-06 – 2014-03-08 (×3): 8.6 mg via ORAL
  Filled 2014-03-06 (×4): qty 1

## 2014-03-06 MED ORDER — SODIUM CHLORIDE 0.9 % IV SOLN
INTRAVENOUS | Status: DC
  Administered 2014-03-06 – 2014-03-07 (×2): via INTRAVENOUS

## 2014-03-06 MED ORDER — IOHEXOL 300 MG/ML  SOLN
80.0000 mL | Freq: Once | INTRAMUSCULAR | Status: AC | PRN
  Administered 2014-03-06: 80 mL via INTRAVENOUS

## 2014-03-06 MED ORDER — 0.9 % SODIUM CHLORIDE (POUR BTL) OPTIME
TOPICAL | Status: DC | PRN
  Administered 2014-03-06: 1000 mL

## 2014-03-06 MED ORDER — DIAZEPAM 5 MG PO TABS: 5.0000 mg | ORAL_TABLET | Freq: Four times a day (QID) | ORAL | Status: DC | PRN

## 2014-03-06 MED ORDER — OXYCODONE HCL 5 MG PO TABS: 5.0000 mg | ORAL_TABLET | Freq: Once | ORAL | Status: DC | PRN

## 2014-03-06 MED ORDER — OXYCODONE-ACETAMINOPHEN 5-325 MG PO TABS
1.0000 | ORAL_TABLET | ORAL | Status: DC | PRN
  Administered 2014-03-06 – 2014-03-08 (×7): 2 via ORAL
  Filled 2014-03-06 (×8): qty 2

## 2014-03-06 MED ORDER — SUCCINYLCHOLINE CHLORIDE 20 MG/ML IJ SOLN
INTRAMUSCULAR | Status: DC | PRN
  Administered 2014-03-06: 130 mg via INTRAVENOUS

## 2014-03-06 MED ORDER — MIDAZOLAM HCL 2 MG/2ML IJ SOLN
INTRAMUSCULAR | Status: AC
  Filled 2014-03-06: qty 2

## 2014-03-06 MED ORDER — MENTHOL 3 MG MT LOZG
1.0000 | LOZENGE | OROMUCOSAL | Status: DC | PRN
  Administered 2014-03-06: 3 mg via ORAL
  Filled 2014-03-06: qty 9

## 2014-03-06 MED ORDER — ARTIFICIAL TEARS OP OINT
TOPICAL_OINTMENT | OPHTHALMIC | Status: DC | PRN
  Administered 2014-03-06: 1 via OPHTHALMIC

## 2014-03-06 MED ORDER — GABAPENTIN 300 MG PO CAPS
600.0000 mg | ORAL_CAPSULE | Freq: Every day | ORAL | Status: DC
  Administered 2014-03-06 – 2014-03-07 (×2): 600 mg via ORAL
  Filled 2014-03-06 (×3): qty 2

## 2014-03-06 MED ORDER — ACETAMINOPHEN 325 MG PO TABS: 650.0000 mg | ORAL_TABLET | ORAL | Status: DC | PRN

## 2014-03-06 MED ORDER — MORPHINE SULFATE 2 MG/ML IJ SOLN: 1.0000 mg | INTRAMUSCULAR | Status: DC | PRN

## 2014-03-06 MED ORDER — ONDANSETRON HCL 4 MG/2ML IJ SOLN: 4.0000 mg | INTRAMUSCULAR | Status: DC | PRN

## 2014-03-06 MED ORDER — ONDANSETRON HCL 4 MG/2ML IJ SOLN
INTRAMUSCULAR | Status: AC
  Filled 2014-03-06: qty 2

## 2014-03-06 MED ORDER — ARTIFICIAL TEARS OP OINT
TOPICAL_OINTMENT | OPHTHALMIC | Status: AC
  Filled 2014-03-06: qty 7

## 2014-03-06 MED ORDER — PROPOFOL 10 MG/ML IV BOLUS
INTRAVENOUS | Status: AC
  Filled 2014-03-06: qty 20

## 2014-03-06 MED ORDER — VANCOMYCIN HCL IN DEXTROSE 1-5 GM/200ML-% IV SOLN
1000.0000 mg | Freq: Two times a day (BID) | INTRAVENOUS | Status: DC
  Administered 2014-03-07 – 2014-03-08 (×3): 1000 mg via INTRAVENOUS
  Filled 2014-03-06 (×5): qty 200

## 2014-03-06 MED ORDER — SODIUM CHLORIDE 0.9 % IV SOLN: 250.0000 mL | INTRAVENOUS | Status: DC

## 2014-03-06 MED ORDER — HYDROMORPHONE HCL 1 MG/ML IJ SOLN
INTRAMUSCULAR | Status: DC
Start: 2014-03-06 — End: 2014-03-06
  Filled 2014-03-06: qty 1

## 2014-03-06 MED ORDER — SODIUM CHLORIDE 0.9 % IR SOLN
Status: DC | PRN
  Administered 2014-03-06: 14:00:00

## 2014-03-06 MED ORDER — SODIUM CHLORIDE 0.9 % IJ SOLN: 3.0000 mL | INTRAMUSCULAR | Status: DC | PRN

## 2014-03-06 MED ORDER — OXYCODONE HCL 5 MG/5ML PO SOLN: 5.0000 mg | Freq: Once | ORAL | Status: DC | PRN

## 2014-03-06 MED ORDER — PROPOFOL 10 MG/ML IV BOLUS
INTRAVENOUS | Status: DC | PRN
  Administered 2014-03-06: 200 mg via INTRAVENOUS
  Administered 2014-03-06: 50 mg via INTRAVENOUS

## 2014-03-06 MED ORDER — DEXAMETHASONE SODIUM PHOSPHATE 10 MG/ML IJ SOLN
INTRAMUSCULAR | Status: AC
  Filled 2014-03-06: qty 1

## 2014-03-06 MED ORDER — FENTANYL CITRATE 0.05 MG/ML IJ SOLN
INTRAMUSCULAR | Status: AC
  Filled 2014-03-06: qty 5

## 2014-03-06 MED ORDER — FENTANYL CITRATE 0.05 MG/ML IJ SOLN
INTRAMUSCULAR | Status: AC
  Filled 2014-03-06: qty 2

## 2014-03-06 SURGICAL SUPPLY — 60 items
BAG DECANTER FOR FLEXI CONT (MISCELLANEOUS) ×4 IMPLANT
BENZOIN TINCTURE PRP APPL 2/3 (GAUZE/BANDAGES/DRESSINGS) IMPLANT
BLADE CLIPPER SURG (BLADE) IMPLANT
BLADE SURG 11 STRL SS (BLADE) IMPLANT
BUR MATCHSTICK NEURO 3.0 LAGG (BURR) ×4 IMPLANT
CANISTER SUCT 3000ML PPV (MISCELLANEOUS) ×4 IMPLANT
CLOSURE WOUND 1/2 X4 (GAUZE/BANDAGES/DRESSINGS)
CONT SPEC 4OZ CLIKSEAL STRL BL (MISCELLANEOUS) ×4 IMPLANT
DECANTER SPIKE VIAL GLASS SM (MISCELLANEOUS) ×4 IMPLANT
DERMABOND ADVANCED (GAUZE/BANDAGES/DRESSINGS) ×2
DERMABOND ADVANCED .7 DNX12 (GAUZE/BANDAGES/DRESSINGS) ×2 IMPLANT
DRAIN CHANNEL 10M FLAT 3/4 FLT (DRAIN) ×4 IMPLANT
DRAPE C-ARM 42X72 X-RAY (DRAPES) IMPLANT
DRAPE LAPAROTOMY 100X72 PEDS (DRAPES) ×4 IMPLANT
DRAPE MICROSCOPE LEICA (MISCELLANEOUS) IMPLANT
DRAPE POUCH INSTRU U-SHP 10X18 (DRAPES) IMPLANT
DRAPE PROXIMA HALF (DRAPES) IMPLANT
DRSG OPSITE POSTOP 3X4 (GAUZE/BANDAGES/DRESSINGS) IMPLANT
DRSG OPSITE POSTOP 4X6 (GAUZE/BANDAGES/DRESSINGS) ×4 IMPLANT
DRSG TEGADERM 4X4.75 (GAUZE/BANDAGES/DRESSINGS) IMPLANT
DURAPREP 6ML APPLICATOR 50/CS (WOUND CARE) ×4 IMPLANT
ELECT COATED BLADE 2.86 ST (ELECTRODE) IMPLANT
ELECT REM PT RETURN 9FT ADLT (ELECTROSURGICAL) ×4
ELECTRODE REM PT RTRN 9FT ADLT (ELECTROSURGICAL) ×2 IMPLANT
EVACUATOR SILICONE 100CC (DRAIN) ×4 IMPLANT
GAUZE SPONGE 4X4 16PLY XRAY LF (GAUZE/BANDAGES/DRESSINGS) IMPLANT
GLOVE BIOGEL PI IND STRL 7.5 (GLOVE) ×2 IMPLANT
GLOVE BIOGEL PI INDICATOR 7.5 (GLOVE) ×2
GLOVE ECLIPSE 7.0 STRL STRAW (GLOVE) ×4 IMPLANT
GLOVE EXAM NITRILE LRG STRL (GLOVE) IMPLANT
GLOVE EXAM NITRILE MD LF STRL (GLOVE) IMPLANT
GLOVE EXAM NITRILE XL STR (GLOVE) IMPLANT
GLOVE EXAM NITRILE XS STR PU (GLOVE) IMPLANT
GOWN STRL REUS W/ TWL LRG LVL3 (GOWN DISPOSABLE) ×4 IMPLANT
GOWN STRL REUS W/ TWL XL LVL3 (GOWN DISPOSABLE) IMPLANT
GOWN STRL REUS W/TWL 2XL LVL3 (GOWN DISPOSABLE) IMPLANT
GOWN STRL REUS W/TWL LRG LVL3 (GOWN DISPOSABLE) ×4
GOWN STRL REUS W/TWL XL LVL3 (GOWN DISPOSABLE)
HEMOSTAT POWDER SURGIFOAM 1G (HEMOSTASIS) ×4 IMPLANT
KIT BASIN OR (CUSTOM PROCEDURE TRAY) ×4 IMPLANT
KIT ROOM TURNOVER OR (KITS) ×4 IMPLANT
LIQUID BAND (GAUZE/BANDAGES/DRESSINGS) IMPLANT
NEEDLE HYPO 25X1 1.5 SAFETY (NEEDLE) IMPLANT
NEEDLE SPNL 22GX3.5 QUINCKE BK (NEEDLE) IMPLANT
NS IRRIG 1000ML POUR BTL (IV SOLUTION) ×4 IMPLANT
PACK LAMINECTOMY NEURO (CUSTOM PROCEDURE TRAY) ×4 IMPLANT
PAD ARMBOARD 7.5X6 YLW CONV (MISCELLANEOUS) ×12 IMPLANT
RUBBERBAND STERILE (MISCELLANEOUS) IMPLANT
SPONGE INTESTINAL PEANUT (DISPOSABLE) ×4 IMPLANT
SPONGE SURGIFOAM ABS GEL SZ50 (HEMOSTASIS) ×4 IMPLANT
STRIP CLOSURE SKIN 1/2X4 (GAUZE/BANDAGES/DRESSINGS) IMPLANT
SUT ETHILON 3 0 FSL (SUTURE) ×4 IMPLANT
SUT VIC AB 3-0 SH 8-18 (SUTURE) ×4 IMPLANT
SUT VICRYL 3-0 RB1 18 ABS (SUTURE) ×8 IMPLANT
SWAB CULTURE LIQ STUART DBL (MISCELLANEOUS) ×4 IMPLANT
SYR 20ML ECCENTRIC (SYRINGE) ×4 IMPLANT
TAPE CLOTH 2X10 TAN LF (GAUZE/BANDAGES/DRESSINGS) IMPLANT
TOWEL OR 17X24 6PK STRL BLUE (TOWEL DISPOSABLE) ×4 IMPLANT
TOWEL OR 17X26 10 PK STRL BLUE (TOWEL DISPOSABLE) ×4 IMPLANT
WATER STERILE IRR 1000ML POUR (IV SOLUTION) ×4 IMPLANT

## 2014-03-06 NOTE — Anesthesia Postprocedure Evaluation (Signed)
Anesthesia Post Note  Patient: Timothy Boone [facility]  Procedure(s) Performed: Procedure(s) (LRB): Exploration of Cervical Wound (N/A) ESOPHAGOSCOPY  Anesthesia type: General  Patient location: PACU  Post pain: Pain level controlled and Adequate analgesia  Post assessment: Post-op Vital signs reviewed, Patient's Cardiovascular Status Stable, Respiratory Function Stable, Patent Airway and Pain level controlled  Last Vitals:  Filed Vitals:   03/06/14 1515  BP: 154/94  Pulse:   Temp:   Resp:     Post vital signs: Reviewed and stable  Level of consciousness: awake, alert  and oriented  Complications: No apparent anesthesia complications

## 2014-03-06 NOTE — Consult Note (Signed)
ANTIBIOTIC CONSULT NOTE - INITIAL  Pharmacy Consult :  Vancomycin Indication : Post-op antibiotics following cervical exploration and evacuation of wound  Allergies  Allergen Reactions  . Chicken Allergy Itching and Other (See Comments)    Itching and burning on hands and feet.  . Demerol [Meperidine]     Hallucinations   . Penicillins Itching  . Tramadol Other (See Comments)    Hallucinations    Dosing Weight : 98 kg  VITALS: Temp: 98.5 F (36.9 C) (03/03 1615) Temp Source: Oral (03/03 1246) BP: 146/95 mmHg (03/03 1600) Pulse Rate: 91 (03/03 1615)  LABS:   03/06/14, 1257   WBC 11.6*  HGB 14.3  PLT 289   Estimated Creatinine Clearance: 75.6 mL/min (by C-G formula based on Cr of 1.42).  MICRO: Recent Results (from the past 720 hour(s))  Surgical pcr screen     Status: None   Collection Time: 02/20/14  9:59 AM  Result Value Ref Range Status   MRSA, PCR NEGATIVE NEGATIVE Final   Staphylococcus aureus NEGATIVE NEGATIVE Final  Gram stain     Status: None   Collection Time: 03/06/14  1:49 PM  Result Value Ref Range Status   Specimen Description WOUND NECK  Final   Special Requests NONE  Final   Gram Stain   Final    FEW WBC PRESENT,BOTH PMN AND MONONUCLEAR NO ORGANISMS SEEN    Report Status 03/06/2014 FINAL  Final    ASSESSMENT:  49 y.o. s/p exploration and evacuation of cervical wound seroma.   Vancomycin ordered for prophylaxis post-op.  Patient has JP drain in place.  GOAL:  Vancomycin Trough 10 - 15 mcg/ml  PLAN:  1. Vancomycin 1000 mg IV every 12 hrs Monitor renal function, WBC, fever curve, any cultures/sensitivities, Vancomycin levels as clinically indicated, and clinical progression.  Estelle June, 03/06/2014,4:52 PM

## 2014-03-06 NOTE — Anesthesia Procedure Notes (Addendum)
Procedure Name: Intubation Date/Time: 03/06/2014 1:30 PM Performed by: Suzy Bouchard Pre-anesthesia Checklist: Patient identified, Timeout performed, Emergency Drugs available, Suction available and Patient being monitored Patient Re-evaluated:Patient Re-evaluated prior to inductionOxygen Delivery Method: Circle system utilized Preoxygenation: Pre-oxygenation with 100% oxygen Intubation Type: IV induction and Rapid sequence Laryngoscope Size: Glidescope Grade View: Grade II Tube type: Oral Number of attempts: 1 Airway Equipment and Method: Stylet and Video-laryngoscopy Placement Confirmation: ETT inserted through vocal cords under direct vision,  breath sounds checked- equal and bilateral and positive ETCO2 Secured at: 22 cm Tube secured with: Tape Dental Injury: Teeth and Oropharynx as per pre-operative assessment  Difficulty Due To: Difficult Airway-  due to edematous airway and Difficult Airway- due to reduced neck mobility Comments: Intubation by Dr. Marcie Bal

## 2014-03-06 NOTE — Consult Note (Signed)
Reason for Consult:Neck swelling Referring Physician: Consuella Lose, MD  Timothy Boone is an 49 y.o. male.  HPI: One week post anterior cervical fusion - 4 levels. Had acute swelling and severe pain with difficulty swallowing, no trouble breathing.  Past Medical History  Diagnosis Date  . No significant past medical history   . Disc degeneration   . Disc degeneration   . Blind right eye   . Chronic back pain   . Degenerative disc disease     Past Surgical History  Procedure Laterality Date  . Degenerative cervical disc    . Fatty tissue Right > 15 yrs. ago     cyst removed on the jaw, at the same time he had a tooth extracted , given heavy sedation, pt. thinks it ws done at surgery center, no anesth. complications that pt. is aware of  . Anterior cervical decompression/discectomy fusion 4 levels N/A 02/28/2014    Procedure: Cervical three-four, Cervical four-five, Cervical five-six, Cervical six-seven anterior cervical decompression with fusion interbody prosthesis plating and bonegraft;  Surgeon: Consuella Lose, MD;  Location: [facility];  Service: Neurosurgery;  Laterality: N/A;  Cervical three-four, Cervical four-five, Cervical five-six, Cervical six-seven anterior cervical decompression with fusion    History reviewed. No pertinent family history.  Social History:  reports that he has been smoking Cigarettes.  He has a 18 pack-year smoking history. He has quit using smokeless tobacco. He reports that he drinks alcohol. He reports that he does not use illicit drugs.  Allergies:  Allergies  Allergen Reactions  . Chicken Allergy Itching and Other (See Comments)    Itching and burning on hands and feet.  . Demerol [Meperidine]     Hallucinations   . Penicillins Itching  . Tramadol Other (See Comments)    Hallucinations     Medications: Reviewed  Results for orders placed or performed during the hospital encounter of 03/06/14 (from the past 48 hour(s))  CBC      Status: Abnormal   Collection Time: 03/06/14 12:57 PM  Result Value Ref Range   WBC 11.6 (H) 4.0 - 10.5 K/uL   RBC 4.62 4.22 - 5.81 MIL/uL   Hemoglobin 14.3 13.0 - 17.0 g/dL   HCT 41.1 39.0 - 52.0 %   MCV 89.0 78.0 - 100.0 fL   MCH 31.0 26.0 - 34.0 pg   MCHC 34.8 30.0 - 36.0 g/dL   RDW 12.6 11.5 - 15.5 %   Platelets 289 150 - 400 K/uL  Gram stain     Status: None   Collection Time: 03/06/14  1:49 PM  Result Value Ref Range   Specimen Description WOUND NECK    Special Requests NONE    Gram Stain      FEW WBC PRESENT,BOTH PMN AND MONONUCLEAR NO ORGANISMS SEEN    Report Status 03/06/2014 FINAL     Ct Soft Tissue Neck W Contrast  03/06/2014   CLINICAL DATA:  Postop day 6 ACDF. Neck swelling and difficulty swallowing. Hoarseness.  EXAM: CT NECK WITH CONTRAST  TECHNIQUE: Multidetector CT imaging of the neck was performed using the standard protocol following the bolus administration of intravenous contrast.  CONTRAST:  41mL OMNIPAQUE IOHEXOL 300 MG/ML  SOLN  COMPARISON:  CT cervical spine earlier today  FINDINGS: Postop ACDF C3 through C7. Anterior plate and screws and interbody spacers in good position. Negative for fracture. No bony erosion or osteomyelitis.  Large right neck hematoma related to recent surgery. This is present lateral to the thyroid  and extends into the prevertebral soft tissues. Retropharyngeal fluid collection at the C3 through C5 level measures 15 x 33 x 48 mm and contains a significant amount of gas. This may represent an abscess. There is mass-effect on the hypopharynx which is displaced to the left and anteriorly. There is also edema with mass effect on the proximal esophagus extending into the superior mediastinum. There is edema around the proximal esophagus. Mediastinitis cannot be excluded.  Mucosal edema right maxillary sinus compatible with sinusitis. Skull base intact.  Negative for adenopathy. Submandibular and parotid glands are normal bilaterally. Thyroid is  normal in density and size. The right thyroid is displaced by hematoma.  Lung apices are clear.  IMPRESSION: Postop ACDF C3 through C7. Large right neck hematoma extending into the prevertebral soft tissues. There is a large fluid collection in the prevertebral tissues containing large gas bubbles, worrisome for abscess. Gas bubbles could be related to postop abscess without infection. Esophagus or hypopharyngeal injury is a consideration. Edema extends into the superior mediastinum and mediastinitis not excluded.  I reviewed the images at the time interpretation with Dr. Ralene Ok   Electronically Signed   By: Franchot Gallo M.D.   On: 03/06/2014 12:14   Ct Cervical Spine Wo Contrast  03/06/2014   CLINICAL DATA:  Six days postop ACDF. Now with difficulty swallowing and neck swelling.  EXAM: CT CERVICAL SPINE WITHOUT CONTRAST  TECHNIQUE: Multidetector CT imaging of the cervical spine was performed without intravenous contrast. Multiplanar CT image reconstructions were also generated.  COMPARISON:  02/28/2013, MRI 11/14/2013  FINDINGS: ACDF C3 through C7. Anterior plate and screws in good position. Interbody spacers in good position. No acute bony abnormality. No fracture or bony erosion.  Extensive soft tissue swelling in the right neck. This is low-density with scattered gas bubbles and is most consistent with hematoma along the surgical tract. Prevertebral fluid collection is present containing gas bubbles. This measures approximately 15 x 35 mm on axial scans. There is a significant amount a gas within the collection which extends back to the surgical plate. There is mass-effect on the hypopharynx which is displaced anteriorly and to the left. There is compression of the proximal esophagus due to prevertebral edema/ fluid.  Lung apices are clear.  IMPRESSION: Postop ACDF C3 through C7. Plate and hardware in good position. No acute bony abnormality.  Extensive soft tissue swelling in the right neck and  prevertebral soft tissues consistent with postop hematoma. Retropharyngeal fluid collection containing large gas bubbles is concerning for abscess. Hypo pharyngeal or esophageal injury cannot be excluded. Correlate with fever and white count and symptoms. Fluid collection is exerting significant mass effect on the hypopharynx and proximal esophagus.  These results were reviewed in person at the time of interpretation on 03/06/2014 at 12:03 pm to Dr. Leeroy Cha , who verbally acknowledged these results.   Electronically Signed   By: Franchot Gallo M.D.   On: 03/06/2014 12:04    WYO:VZCHYIFO except as listed in admit H&P  Blood pressure 156/69, pulse 100, temperature 99.3 F (37.4 C), temperature source Oral, resp. rate 12, weight 98.431 kg (217 lb), SpO2 95 %.  PHYSICAL EXAM: Overall appearance:  Healthy appearing, in no distress, no stridor. Head:  Normocephalic, atraumatic. Ears: External ears normal. Nose: External nose is healthy in appearance. Internal nasal exam free of any lesions or obstruction. Oral Cavity:  There are no mucosal lesions or masses identified. Few remaining teeth. Neuro:  No identifiable neurologic deficits. Neck: Right anterior transverse  scar healing well. Firm swelling of the surgical bed without erythema of the skin and no fluctuance..  Studies Reviewed: CT neck.   Procedures: none   Assessment/Plan: Agree with need for neck exploration. I will be available for induction of anesthesia in case of airway difficulty and to perform endoscopy if needed.  Heatherly Stenner 03/06/2014, 3:46 PM

## 2014-03-06 NOTE — H&P (Signed)
CC:  Neck pain, swelling, difficulty swallowing  HPI: Timothy Boone is a 49 y.o. male who underwent 4 level ACDF last Friday (6 days ago). He had some expected postoperative dysphagia, but was doing relatively well until yesterday. He says he was coughing a lot last evening and began to develop progressively worsening neck and right shoulder/arm pain. His swallowing has also been getting worse, and is having difficulty getting anything down. He came in today and was evaluated and found to have a large postoperative hematoma. He has not had any fevers/chills at home, nor any drainage from his wound.  PMH: Past Medical History  Diagnosis Date  . No significant past medical history   . Disc degeneration   . Disc degeneration   . Blind right eye   . Chronic back pain   . Degenerative disc disease     PSH: Past Surgical History  Procedure Laterality Date  . Degenerative cervical disc    . Fatty tissue Right > 15 yrs. ago     cyst removed on the jaw, at the same time he had a tooth extracted , given heavy sedation, pt. thinks it ws done at surgery center, no anesth. complications that pt. is aware of  . Anterior cervical decompression/discectomy fusion 4 levels N/A 02/28/2014    Procedure: Cervical three-four, Cervical four-five, Cervical five-six, Cervical six-seven anterior cervical decompression with fusion interbody prosthesis plating and bonegraft;  Surgeon: Consuella Lose, MD;  Location: [facility];  Service: Neurosurgery;  Laterality: N/A;  Cervical three-four, Cervical four-five, Cervical five-six, Cervical six-seven anterior cervical decompression with fusion    SH: History  Substance Use Topics  . Smoking status: Current Every Day Smoker -- 0.50 packs/day for 36 years    Types: Cigarettes  . Smokeless tobacco: Former Systems developer  . Alcohol Use: Yes     Comment: not regular, partner states "3x's yr."    MEDS: Prior to Admission medications   Medication Sig Start Date End Date  Taking? Authorizing Provider  calcium carbonate (TUMS EX) 750 MG chewable tablet Chew 2 tablets by mouth daily as needed for heartburn.    Historical Provider, MD  cyclobenzaprine (FLEXERIL) 10 MG tablet Take 1 tablet (10 mg total) by mouth 3 (three) times daily as needed for muscle spasms. 03/01/14   Consuella Lose, MD  gabapentin (NEURONTIN) 300 MG capsule Take 600 mg by mouth at bedtime.    Historical Provider, MD  indomethacin (INDOCIN) 25 MG capsule Take 1 capsule (25 mg total) by mouth 3 (three) times daily as needed. 03/14/14   Consuella Lose, MD  meloxicam (MOBIC) 7.5 MG tablet Take 7.5 mg by mouth 2 (two) times daily as needed.     Historical Provider, MD  oxyCODONE-acetaminophen (PERCOCET/ROXICET) 5-325 MG per tablet Take 1-2 tablets by mouth every 4 (four) hours as needed for moderate pain. 03/01/14   Consuella Lose, MD    ALLERGY: Allergies  Allergen Reactions  . Chicken Allergy Itching and Other (See Comments)    Itching and burning on hands and feet.  . Demerol [Meperidine]     Hallucinations   . Penicillins Itching  . Tramadol Other (See Comments)    Hallucinations     ROS: ROS  NEUROLOGIC EXAM: Awake, alert, oriented Memory and concentration grossly intact Speech fluent, appropriate CN grossly intact Motor exam: Upper Extremities Deltoid Bicep Tricep Grip  Right 5/5 5/5 5/5 5/5  Left 5/5 5/5 5/5 5/5   Lower Extremity IP Quad PF DF EHL  Right 5/5  5/5 5/5 5/5 5/5  Left 5/5 5/5 5/5 5/5 5/5   Sensation grossly intact to LT  Neck wound is firm, with deviation of the trachea to the left. No erythema or TTP. No drainage.  IMGAING: CT scan demonstrates a large fluid collection extending from the subcutaneous space, tracking lateral to the thyroid cartilage, and extending into the prevertebral space adjacent to the cervical plate. There is mass effect upon the trachea and esophagus. Gas within the hematoma could be postoperative, but also could be a sign of  infection.  IMPRESSION: - 49 y.o. male with postoperative dysphagia and neck hematoma 6 days after 4 level ACDF. There is radiographic concern for infection which raises the unlikely possibility of occult esophageal/retropharyngeal injury.  PLAN: - OR for wound exploration, evacuation of hematoma, and laryngoscopy/esophagoscopy in conjunction with Dr. Constance Holster, ENT.  I reviewed the imaging findings with the patient and his wife. Options were discussed including observation vs surgical exploration. Risks and benefits were reviewed. They are willing to proceed with surgical exploration. All questions were answered.

## 2014-03-06 NOTE — Op Note (Signed)
OPERATIVE REPORT  DATE OF SURGERY: 03/06/2014  PATIENT:  Timothy Boone,  49 y.o. male  PRE-OPERATIVE DIAGNOSIS:  Hematoma V. Abscess  POST-OPERATIVE DIAGNOSIS:  hemotoma vs abscess  PROCEDURE:  Procedure(s): Exploration of Cervical Wound ESOPHAGOSCOPY  SURGEON:  Beckie Salts, MD  ASSISTANTS: none  ANESTHESIA:   General   EBL:  0 ml  DRAINS: none  LOCAL MEDICATIONS USED:  None  SPECIMEN:  none  COUNTS:  Correct  PROCEDURE DETAILS: The patient was taken to the operating room and placed on the operating table in the supine position. Following induction of general endotracheal anesthesia using the Glide Scope, the neck was prepped and draped and Neck exploration was performed and revealed seroma/hematoma. Rigid cervical esophagoscopy was then performed by me and the esophagus and pharynx was without defect or infection. The care of the patient was then returned to Neurosurgical.     PATIENT DISPOSITION:  To the care of Neurosurgery for completion of the procedure.

## 2014-03-06 NOTE — Progress Notes (Signed)
eLink Physician-Brief Progress Note Patient Name: Timothy Boone DOB: 1965/11/06 MRN: [id number]   Date of Service  03/06/2014  HPI/Events of Note  49 yo male admitted to ICU s/p exploration of cervical wound, evacuation of cervical hematoma/seroma and placement of JP drain. PMH: None. BP = 141/86. HR = 91 and RR = 18. Sat = 96%  eICU Interventions  Patient appears to be stable. No further CCM intervention indicated at this time.     Intervention Category Evaluation Type: New Patient Evaluation  Lysle Dingwall 03/06/2014, 5:38 PM

## 2014-03-06 NOTE — Op Note (Signed)
PREOP DIAGNOSIS:  1. Postoperative neck hematoma   POSTOP DIAGNOSIS:  1. Postoperative neck seroma  PROCEDURE: 1. Exploration of cervical wound 2. Evacuation of seroma 3. Laryngoscopy/esophagoscopy (Performed by Dr. Constance Holster)  SURGEON: Dr. Consuella Lose, MD  CO-SURGEON: Dr. Elie Goody, MD (ENT)  ASSISTANT: Dr. Leeroy Cha, MD  ANESTHESIA: General Endotracheal  EBL: Minimal  SPECIMENS: Wound culture   DRAINS: 33F JP in the prevertebral space  COMPLICATIONS: None immediate  CONDITION: Hemodynamically stable to PACU  HISTORY: Timothy Boone is a 49 y.o. male brought to the ED emergently after being seen 6 days after ACDF with severe dysphagia, neck swelling, and CT demonstrating a large postop fluid collection with air which was concerning for infection. Surgical exploration was therefore indicated. The risks and benefits were reviewed with the patient and his wife. After all questions were answered, consent was obtained.  PROCEDURE IN DETAIL: After informed consent was obtained and witnessed, the patient was brought to the operating room. After induction of general anesthesia, the patient was positioned on the operative table in the supine position. All pressure points were meticulously padded. Dermabond was removed from the incision with acetone, and the area was prepped and draped in usual sterile fashion.   After timeout was conducted, the previous incision was opened. The platysma was then divided along the previous incision. The thyroid cartilage and strap muscles were identified medially, and the sternocleidomastoid was identified laterally. In between this, hand-held retractors were placed, and a large seroma was identified and evacuated. This was traced down to the prevertebral space, and the anterior cervical plate was identified. Wound cultures were taken from this space. All identified tissues appeared to be very healthy, and granulating normally. There is no obvious  sign of infection. The serous fluid also was very thin, and was certainly not purulent. The entire length of the cervical opening was then inspected, and the wound was irrigated with copious amounts of bacitracin irrigation. The esophagus was then inspected from the incision, and no obvious area of injury was identified.  At this point, Dr. Constance Holster performed a laryngoscopy and esophagoscopy. He did not appear to find any evidence of pharyngeal or esophageal injury. The details of this are dictated in a separate report.  At this point, after the wound is irrigated, and hemostasis was confirmed. A 10 French Jackson-Pratt drain was placed and tunneled subcutaneously. The platysma was again closed using interrupted 3-0 Vicryl stitches, and the deep subcuticular layer was closed using interrupted 3-0 Vicryl stitches. The skin was then closed in Dermabond. The drain was secured using 3-0 nylon.  At the end of the case all sponge, needle, and instrument counts were correct. The patient was then transferred to the stretcher, extubated, and taken to the post anesthesia care unit in stable hemodynamic condition.

## 2014-03-06 NOTE — Anesthesia Preprocedure Evaluation (Signed)
Anesthesia Evaluation  Patient identified by MRN, date of birth, ID band Patient awake    Reviewed: Allergy & Precautions, NPO status , Patient's Chart, lab work & pertinent test results  Airway Mallampati: III  TM Distance: >3 FB Neck ROM: limited Positive for:  Tracheal deviation  Comment: Poor dentition.  Pt is s/p cervical fusion recently.  Now with possible hematoma causing tracheal deviation.  Pt is breathing easily and phonating normally. Dental   Pulmonary Current Smoker,  breath sounds clear to auscultation        Cardiovascular negative cardio ROS  Rhythm:regular Rate:Normal     Neuro/Psych    GI/Hepatic   Endo/Other    Renal/GU      Musculoskeletal  (+) Arthritis -,   Abdominal   Peds  Hematology   Anesthesia Other Findings   Reproductive/Obstetrics                             Anesthesia Physical Anesthesia Plan  ASA: II and emergent  Anesthesia Plan: General   Post-op Pain Management:    Induction: Intravenous  Airway Management Planned: Oral ETT and Video Laryngoscope Planned  Additional Equipment:   Intra-op Plan:   Post-operative Plan: Possible Post-op intubation/ventilation  Informed Consent: I have reviewed the patients History and Physical, chart, labs and discussed the procedure including the risks, benefits and alternatives for the proposed anesthesia with the patient or authorized representative who has indicated his/her understanding and acceptance.     Plan Discussed with: CRNA, Anesthesiologist and Surgeon  Anesthesia Plan Comments:         Anesthesia Quick Evaluation

## 2014-03-06 NOTE — Transfer of Care (Signed)
Immediate Anesthesia Transfer of Care Note  Patient: Timothy Boone  Procedure(s) Performed: Procedure(s): Exploration of Cervical Wound (N/A) ESOPHAGOSCOPY  Patient Location: PACU  Anesthesia Type:General  Level of Consciousness: awake and alert   Airway & Oxygen Therapy: Patient Spontanous Breathing and Patient connected to nasal cannula oxygen  Post-op Assessment: Report given to RN, Post -op Vital signs reviewed and stable and Patient moving all extremities  Post vital signs: Reviewed and stable  Last Vitals:  Filed Vitals:   03/06/14 1246  BP: 149/96  Pulse: 86  Temp: 36.7 C  Resp: 18    Complications: No apparent anesthesia complications

## 2014-03-06 NOTE — Progress Notes (Signed)
Pt seen in PACU. Reporting improvement in pain, no DIB. Moving all extremities well. Neck soft.

## 2014-03-07 ENCOUNTER — Encounter (HOSPITAL_COMMUNITY): Payer: Self-pay | Admitting: Neurosurgery

## 2014-03-07 MED ORDER — DEXAMETHASONE SODIUM PHOSPHATE 4 MG/ML IJ SOLN
4.0000 mg | Freq: Four times a day (QID) | INTRAMUSCULAR | Status: DC
  Administered 2014-03-07 – 2014-03-08 (×5): 4 mg via INTRAVENOUS
  Filled 2014-03-07 (×5): qty 1

## 2014-03-07 NOTE — Clinical Social Work Note (Signed)
Clinical Social Worker received standing order referral for possible ST-SNF placement.  Chart reviewed.  Spoke with RN Case Manager who will follow up with patient to discuss home health needs if appropriate.    CSW signing off - please re consult if social work needs arise.  Barbette Or, [place]

## 2014-03-07 NOTE — Progress Notes (Signed)
Pt seen and examined. No issues overnight. Reports neck feels much better, right shoulder/arm pain is also improved. Swallowing is also improved.  EXAM: Temp:  [97.8 F (36.6 C)-99.3 F (37.4 C)] 98.1 F (36.7 C) (03/04 0808) Pulse Rate:  [78-105] 81 (03/04 0808) Resp:  [12-23] 21 (03/04 0808) BP: (107-167)/(61-113) 128/81 mmHg (03/04 0808) SpO2:  [92 %-100 %] 99 % (03/04 0808) Weight:  [98.431 kg (217 lb)] 98.431 kg (217 lb) (03/03 1246) Intake/Output      03/03 0701 - 03/04 0700 03/04 0701 - 03/05 0700   P.O. 240    I.V. (mL/kg) 1665 (16.9)    IV Piggyback 200    Total Intake(mL/kg) 2105 (21.4)    Urine (mL/kg/hr) 2450 0 (0)   Drains 30    Stool  0 (0)   Total Output 2480 0   Net -375 0         Awake, alert, oriented Good strength throughout Neck soft Drain in place, ~30 cc  LABS: Lab Results  Component Value Date   CREATININE 0.99 03/06/2014   BUN 9 03/06/2014   NA 134* 03/06/2014   K 4.6 03/06/2014   CL 99 03/06/2014   CO2 29 03/06/2014   Lab Results  Component Value Date   WBC 11.6* 03/06/2014   HGB 14.3 03/06/2014   HCT 41.1 03/06/2014   MCV 89.0 03/06/2014   PLT 289 03/06/2014    IMPRESSION: - 49 y.o. male s/p neck exploration, seroma evacuation, doing well  PLAN: - transfer to floor - keep drain one more day, likely discharge if ok tomorrow - Will cont decadron, likely d/c on medrol dosepak

## 2014-03-07 NOTE — Progress Notes (Signed)
UR completed.  Shafer Swamy, RN BSN [facility] CCM Trauma/Neuro ICU Case Manager [phone]  

## 2014-03-08 LAB — WOUND CULTURE: CULTURE: NO GROWTH

## 2014-03-08 MED ORDER — ALUM & MAG HYDROXIDE-SIMETH 200-200-20 MG/5ML PO SUSP
30.0000 mL | Freq: Four times a day (QID) | ORAL | Status: DC | PRN
  Administered 2014-03-08: 30 mL via ORAL
  Filled 2014-03-08: qty 30

## 2014-03-08 NOTE — Progress Notes (Signed)
Patient is discharged form room 4N27 at this time. Alert and in stable condition. IV site d/c'd. Instructions read to patient and understanding verbalized. Left unit via wheelchair with wife and all belongings at side.

## 2014-03-08 NOTE — Discharge Summary (Signed)
Physician Discharge Summary  Patient ID: Timothy Boone MRN: [id number] DOB/AGE: [birth year] 49 y.o.  Admit date: 03/06/2014 Discharge date: 03/08/2014  Admission Diagnoses: Anterior cervical wound seroma    Discharge Diagnoses: Same   Discharged Condition: good  Hospital Course: The patient was admitted on 03/06/2014 and taken to the operating room where the patient underwent anterior exploration for removal of seroma. The patient tolerated the procedure well and was taken to the recovery room and then to the floor in stable condition. The hospital course was routine. There were no complications. The wound remained clean dry and intact. Pt had appropriate neck soreness. No complaints of arm pain or new N/T/W. drain had very little output and was removed just prior to discharge. He was tolerating a regular diet and solid foods without difficulty. The patient remained afebrile with stable vital signs, and tolerated a regular diet. The patient continued to increase activities, and pain was well controlled with oral pain medications.   Consults: None  Significant Diagnostic Studies:  Results for orders placed or performed during the hospital encounter of 03/06/14  Anaerobic culture  Result Value Ref Range   Specimen Description WOUND NECK    Special Requests NONE    Gram Stain      FEW WBC PRESENT,BOTH PMN AND MONONUCLEAR NO ORGANISMS SEEN Performed at [facility] Performed at [place]; CULTURE IN PROGRESS FOR 5 DAYS Performed at [facility]    Report Status PENDING   Gram stain  Result Value Ref Range   Specimen Description WOUND NECK    Special Requests NONE    Gram Stain      FEW WBC PRESENT,BOTH PMN AND MONONUCLEAR NO ORGANISMS SEEN    Report Status 03/06/2014 FINAL   Wound culture  Result Value Ref Range   Specimen Description WOUND NECK    Special Requests NONE    Gram Stain      FEW WBC PRESENT,BOTH PMN  AND MONONUCLEAR NO ORGANISMS SEEN Performed at [facility] Performed at [facility]    Culture      NO GROWTH 1 DAY Performed at [facility]    Report Status PENDING   AFB culture with smear  Result Value Ref Range   Specimen Description WOUND NECK    Special Requests NONE    Acid Fast Smear      NO ACID FAST BACILLI SEEN Performed at [facility]    Culture      CULTURE WILL BE EXAMINED FOR 6 WEEKS BEFORE ISSUING A FINAL REPORT Performed at [facility]    Report Status PENDING   CBC  Result Value Ref Range   WBC 11.6 (H) 4.0 - 10.5 K/uL   RBC 4.62 4.22 - 5.81 MIL/uL   Hemoglobin 14.3 13.0 - 17.0 g/dL   HCT 41.1 39.0 - 52.0 %   MCV 89.0 78.0 - 100.0 fL   MCH 31.0 26.0 - 34.0 pg   MCHC 34.8 30.0 - 36.0 g/dL   RDW 12.6 11.5 - 15.5 %   Platelets 289 150 - 400 K/uL  Basic metabolic panel  Result Value Ref Range   Sodium 134 (L) 135 - 145 mmol/L   Potassium 4.6 3.5 - 5.1 mmol/L   Chloride 99 96 - 112 mmol/L   CO2 29 19 - 32 mmol/L   Glucose, Bld 109 (H) 70 - 99 mg/dL   BUN 9  6 - 23 mg/dL   Creatinine, Ser 0.99 0.50 - 1.35 mg/dL   Calcium 9.5 8.4 - 10.5 mg/dL   GFR calc non Af Amer >90 >90 mL/min   GFR calc Af Amer >90 >90 mL/min   Anion gap 6 5 - 15    Dg Cervical Spine 2-3 Views  02/28/2014   CLINICAL DATA:  Postop C3-C7 fusion, cervical spondylosis with myelopathy  EXAM: DG C-ARM 61-120 MIN; CERVICAL SPINE - 2-3 VIEW  TECHNIQUE: Three intraoperative views of the cervical spine submitted  CONTRAST:  None  FLUOROSCOPY TIME:  If the device does not provide the exposure index:  Fluoroscopy Time (in minutes and seconds):  8 seconds  Number of Acquired Images:  3  COMPARISON:  Cervical MRI 11/14/2013  FINDINGS: Three views of cervical spine submitted. Endotracheal and NG tube partially visualized. There is anterior cervical spine fusion with metallic plate and screws at C3, C4, C5, C6 and C7 level. There is anatomic alignment.   IMPRESSION: Anterior cervical fusion with metallic plate and screws C3, C4, C5 C6 and C7 level. There is anatomic alignment.   Electronically Signed   By: Lahoma Crocker M.D.   On: 02/28/2014 14:05   Ct Soft Tissue Neck W Contrast  03/06/2014   CLINICAL DATA:  Postop day 6 ACDF. Neck swelling and difficulty swallowing. Hoarseness.  EXAM: CT NECK WITH CONTRAST  TECHNIQUE: Multidetector CT imaging of the neck was performed using the standard protocol following the bolus administration of intravenous contrast.  CONTRAST:  53mL OMNIPAQUE IOHEXOL 300 MG/ML  SOLN  COMPARISON:  CT cervical spine earlier today  FINDINGS: Postop ACDF C3 through C7. Anterior plate and screws and interbody spacers in good position. Negative for fracture. No bony erosion or osteomyelitis.  Large right neck hematoma related to recent surgery. This is present lateral to the thyroid and extends into the prevertebral soft tissues. Retropharyngeal fluid collection at the C3 through C5 level measures 15 x 33 x 48 mm and contains a significant amount of gas. This may represent an abscess. There is mass-effect on the hypopharynx which is displaced to the left and anteriorly. There is also edema with mass effect on the proximal esophagus extending into the superior mediastinum. There is edema around the proximal esophagus. Mediastinitis cannot be excluded.  Mucosal edema right maxillary sinus compatible with sinusitis. Skull base intact.  Negative for adenopathy. Submandibular and parotid glands are normal bilaterally. Thyroid is normal in density and size. The right thyroid is displaced by hematoma.  Lung apices are clear.  IMPRESSION: Postop ACDF C3 through C7. Large right neck hematoma extending into the prevertebral soft tissues. There is a large fluid collection in the prevertebral tissues containing large gas bubbles, worrisome for abscess. Gas bubbles could be related to postop abscess without infection. Esophagus or hypopharyngeal injury is a  consideration. Edema extends into the superior mediastinum and mediastinitis not excluded.  I reviewed the images at the time interpretation with Dr. Ralene Ok   Electronically Signed   By: Franchot Gallo M.D.   On: 03/06/2014 12:14   Ct Cervical Spine Wo Contrast  03/06/2014   CLINICAL DATA:  Six days postop ACDF. Now with difficulty swallowing and neck swelling.  EXAM: CT CERVICAL SPINE WITHOUT CONTRAST  TECHNIQUE: Multidetector CT imaging of the cervical spine was performed without intravenous contrast. Multiplanar CT image reconstructions were also generated.  COMPARISON:  02/28/2013, MRI 11/14/2013  FINDINGS: ACDF C3 through C7. Anterior plate and screws in good position. Interbody spacers in  good position. No acute bony abnormality. No fracture or bony erosion.  Extensive soft tissue swelling in the right neck. This is low-density with scattered gas bubbles and is most consistent with hematoma along the surgical tract. Prevertebral fluid collection is present containing gas bubbles. This measures approximately 15 x 35 mm on axial scans. There is a significant amount a gas within the collection which extends back to the surgical plate. There is mass-effect on the hypopharynx which is displaced anteriorly and to the left. There is compression of the proximal esophagus due to prevertebral edema/ fluid.  Lung apices are clear.  IMPRESSION: Postop ACDF C3 through C7. Plate and hardware in good position. No acute bony abnormality.  Extensive soft tissue swelling in the right neck and prevertebral soft tissues consistent with postop hematoma. Retropharyngeal fluid collection containing large gas bubbles is concerning for abscess. Hypo pharyngeal or esophageal injury cannot be excluded. Correlate with fever and white count and symptoms. Fluid collection is exerting significant mass effect on the hypopharynx and proximal esophagus.  These results were reviewed in person at the time of interpretation on 03/06/2014 at  12:03 pm to Dr. Leeroy Cha , who verbally acknowledged these results.   Electronically Signed   By: Franchot Gallo M.D.   On: 03/06/2014 12:04   Dg C-arm 1-60 Min  02/28/2014   CLINICAL DATA:  Postop C3-C7 fusion, cervical spondylosis with myelopathy  EXAM: DG C-ARM 61-120 MIN; CERVICAL SPINE - 2-3 VIEW  TECHNIQUE: Three intraoperative views of the cervical spine submitted  CONTRAST:  None  FLUOROSCOPY TIME:  If the device does not provide the exposure index:  Fluoroscopy Time (in minutes and seconds):  8 seconds  Number of Acquired Images:  3  COMPARISON:  Cervical MRI 11/14/2013  FINDINGS: Three views of cervical spine submitted. Endotracheal and NG tube partially visualized. There is anterior cervical spine fusion with metallic plate and screws at C3, C4, C5, C6 and C7 level. There is anatomic alignment.  IMPRESSION: Anterior cervical fusion with metallic plate and screws C3, C4, C5 C6 and C7 level. There is anatomic alignment.   Electronically Signed   By: Lahoma Crocker M.D.   On: 02/28/2014 14:05    Antibiotics:  Anti-infectives    Start     Dose/Rate Route Frequency Ordered Stop   03/07/14 0200  vancomycin (VANCOCIN) IVPB 1000 mg/200 mL premix     1,000 mg 200 mL/hr over 60 Minutes Intravenous Every 12 hours 03/06/14 1659     03/06/14 1402  bacitracin 50,000 Units in sodium chloride irrigation 0.9 % 500 mL irrigation  Status:  Discontinued       As needed 03/06/14 1402 03/06/14 1429   03/06/14 1328  vancomycin (VANCOCIN) 1 GM/200ML IVPB    Comments:  Daye, Devonia   : cabinet override      03/06/14 1328 03/06/14 1452      Discharge Exam: Blood pressure 144/74, pulse 74, temperature 98.4 F (36.9 C), temperature source Oral, resp. rate 20, height 6' (1.829 m), weight 213 lb 8 oz (96.843 kg), SpO2 99 %. Neurologic: Grossly normal Incision clean dry and intact without swelling, trachea midline, very strong  Discharge Medications:     Medication List    TAKE these medications         calcium carbonate 750 MG chewable tablet  Commonly known as:  TUMS EX  Chew 2 tablets by mouth daily as needed for heartburn.     cyclobenzaprine 10 MG tablet  Commonly known as:  FLEXERIL  Take 1 tablet (10 mg total) by mouth 3 (three) times daily as needed for muscle spasms.     gabapentin 300 MG capsule  Commonly known as:  NEURONTIN  Take 600 mg by mouth at bedtime.     indomethacin 25 MG capsule  Commonly known as:  INDOCIN  Take 1 capsule (25 mg total) by mouth 3 (three) times daily as needed.  Start taking on:  03/14/2014     meloxicam 7.5 MG tablet  Commonly known as:  MOBIC  Take 7.5 mg by mouth 2 (two) times daily as needed.     oxyCODONE-acetaminophen 5-325 MG per tablet  Commonly known as:  PERCOCET/ROXICET  Take 1-2 tablets by mouth every 4 (four) hours as needed for moderate pain.        Disposition: Home   Final Dx: Evacuation of anterior cervical wound seroma      Discharge Instructions    Call MD for:  difficulty breathing, headache or visual disturbances    Complete by:  As directed      Call MD for:  persistant nausea and vomiting    Complete by:  As directed      Call MD for:  redness, tenderness, or signs of infection (pain, swelling, redness, odor or green/yellow discharge around incision site)    Complete by:  As directed      Call MD for:  severe uncontrolled pain    Complete by:  As directed      Call MD for:  temperature >100.4    Complete by:  As directed      Diet - low sodium heart healthy    Complete by:  As directed      Discharge instructions    Complete by:  As directed   No strenuous activity or heavy lifting, may shower normally     Increase activity slowly    Complete by:  As directed               Signed: JONES,DAVID S 03/08/2014, 8:26 AM

## 2014-03-10 ENCOUNTER — Encounter (HOSPITAL_COMMUNITY): Payer: Self-pay | Admitting: Physical Medicine and Rehabilitation

## 2014-03-10 ENCOUNTER — Emergency Department (HOSPITAL_COMMUNITY)
Admission: EM | Admit: 2014-03-10 | Discharge: 2014-03-10 | Disposition: A | Discharge status: Home / Self Care | Payer: Medicare Other | Attending: Emergency Medicine | Admitting: Emergency Medicine

## 2014-03-10 DIAGNOSIS — G8929 Other chronic pain: Secondary | ICD-10-CM | POA: Diagnosis not present

## 2014-03-10 DIAGNOSIS — Z880 Allergy status to penicillin: Secondary | ICD-10-CM | POA: Diagnosis not present

## 2014-03-10 DIAGNOSIS — G8918 Other acute postprocedural pain: Secondary | ICD-10-CM | POA: Diagnosis not present

## 2014-03-10 DIAGNOSIS — Z79899 Other long term (current) drug therapy: Secondary | ICD-10-CM | POA: Insufficient documentation

## 2014-03-10 DIAGNOSIS — Z791 Long term (current) use of non-steroidal anti-inflammatories (NSAID): Secondary | ICD-10-CM | POA: Diagnosis not present

## 2014-03-10 DIAGNOSIS — M542 Cervicalgia: Secondary | ICD-10-CM | POA: Insufficient documentation

## 2014-03-10 LAB — CBC WITH DIFFERENTIAL/PLATELET
Basophils Absolute: 0 10*3/uL (ref 0.0–0.1)
Basophils Relative: 0 % (ref 0–1)
EOS PCT: 2 % (ref 0–5)
Eosinophils Absolute: 0.2 10*3/uL (ref 0.0–0.7)
HEMATOCRIT: 40.9 % (ref 39.0–52.0)
HEMOGLOBIN: 14.1 g/dL (ref 13.0–17.0)
LYMPHS ABS: 3.5 10*3/uL (ref 0.7–4.0)
LYMPHS PCT: 40 % (ref 12–46)
MCH: 30.8 pg (ref 26.0–34.0)
MCHC: 34.5 g/dL (ref 30.0–36.0)
MCV: 89.3 fL (ref 78.0–100.0)
Monocytes Absolute: 0.5 10*3/uL (ref 0.1–1.0)
Monocytes Relative: 6 % (ref 3–12)
Neutro Abs: 4.4 10*3/uL (ref 1.7–7.7)
Neutrophils Relative %: 52 % (ref 43–77)
PLATELETS: 342 10*3/uL (ref 150–400)
RBC: 4.58 MIL/uL (ref 4.22–5.81)
RDW: 12.5 % (ref 11.5–15.5)
WBC: 8.6 10*3/uL (ref 4.0–10.5)

## 2014-03-10 LAB — BASIC METABOLIC PANEL
ANION GAP: 4 — AB (ref 5–15)
BUN: 13 mg/dL (ref 6–23)
CHLORIDE: 103 mmol/L (ref 96–112)
CO2: 30 mmol/L (ref 19–32)
CREATININE: 0.94 mg/dL (ref 0.50–1.35)
Calcium: 9 mg/dL (ref 8.4–10.5)
GFR calc Af Amer: >90 mL/min (ref >90)
GFR calc non Af Amer: >90 mL/min (ref >90)
GLUCOSE: 90 mg/dL (ref 70–99)
Potassium: 4.1 mmol/L (ref 3.5–5.1)
Sodium: 137 mmol/L (ref 135–145)

## 2014-03-10 MED ORDER — HYDROMORPHONE HCL 1 MG/ML IJ SOLN
1.0000 mg | Freq: Once | INTRAMUSCULAR | Status: AC
  Administered 2014-03-10: 1 mg via INTRAVENOUS
  Filled 2014-03-10: qty 1

## 2014-03-10 MED ORDER — DIAZEPAM 5 MG PO TABS
5.0000 mg | ORAL_TABLET | Freq: Once | ORAL | Status: AC
  Administered 2014-03-10: 5 mg via ORAL
  Filled 2014-03-10: qty 1

## 2014-03-10 MED ORDER — HYDROMORPHONE HCL 1 MG/ML IJ SOLN
1.0000 mg | Freq: Once | INTRAMUSCULAR | Status: AC
  Administered 2014-03-10: 1 mg via INTRAVENOUS

## 2014-03-10 MED ORDER — METHYLPREDNISOLONE (PAK) 4 MG PO TABS: ORAL_TABLET | ORAL | Status: DC

## 2014-03-10 MED ORDER — HYDROMORPHONE HCL 2 MG PO TABS
2.0000 mg | ORAL_TABLET | ORAL | Status: DC | PRN
Start: 2014-03-10 — End: 2018-11-05

## 2014-03-10 MED ORDER — HYDROMORPHONE HCL 4 MG PO TABS: 4.0000 mg | ORAL_TABLET | ORAL | Status: DC | PRN

## 2014-03-10 MED ORDER — DIAZEPAM 5 MG PO TABS
5.0000 mg | ORAL_TABLET | Freq: Two times a day (BID) | ORAL | Status: DC
Start: 2014-03-10 — End: 2018-11-05

## 2014-03-10 MED ORDER — HYDROMORPHONE HCL 1 MG/ML IJ SOLN
1.0000 mg | Freq: Once | INTRAMUSCULAR | Status: DC
  Filled 2014-03-10: qty 1

## 2014-03-10 NOTE — Progress Notes (Signed)
Patient ID: Timothy Boone, male   DOB: [birth year], 50 y.o.   MRN: [id number] Subjective:  Timothy Boone is a patient of Dr. Cleotilde Neer. He underwent an anterior cervical discectomy fusion and plating and subtotally a removal of a prevertebral hematoma. He was discharged on Saturday. He presents to the ER complaining of intrascapular pain and pain in his bilateral trapezius. He's had no trouble with his wound. He is not complaining of dysphagia. He essentially comes in for pain management. He has run out of Percocet and didn't think they were working much. The IV Dilaudid that he was given in the ER seems to have helped.  Objective: Vital signs in last 24 hours: Temp:  [98 F (36.7 C)] 98 F (36.7 C) (03/07 0826) Pulse Rate:  [56-71] 71 (03/07 1230) Resp:  [18] 18 (03/07 0826) BP: (123-140)/(80-91) 134/86 mmHg (03/07 1230) SpO2:  [97 %-100 %] 99 % (03/07 1230) Weight:  [95.255 kg (210 lb)] 95.255 kg (210 lb) (03/07 0826)  Intake/Output from previous day:   Intake/Output this shift:    Physical exam the patient is alert and pleasant. He looks well. His cervical wound is mildly swollen, about what I expect after surgery. There is no midline shift. His strength is normal in all 4 extremities.  Lab Results:  Recent Labs  03/10/14 0915  WBC 8.6  HGB 14.1  HCT 40.9  PLT 342   BMET  Recent Labs  03/10/14 0915  NA 137  K 4.1  CL 103  CO2 30  GLUCOSE 90  BUN 13  CREATININE 0.94  CALCIUM 9.0    Studies/Results: No results found.  Assessment/Plan: Cervicalgia, intrascapular pain: I have told the patient this pain is quite common after surgery. I think he would respond to a Medrol Dosepak, changing his pain medication to Dilaudid, and Valium for muscle spasms. I offered to admit the patient but he prefers to go home. He is to follow with Dr. Kathyrn Sheriff in 7 days. I have answered all the patient's, and his wife's, questions.      [name] 03/10/2014, 12:55  PM

## 2014-03-10 NOTE — ED Provider Notes (Signed)
CSN: [id number]     Arrival date & time 03/10/14  3354 History   First MD Initiated Contact with Patient 03/10/14 0825     Chief Complaint  Patient presents with  . Neck Pain   Timothy Boone is a 49 y.o. male who is s/p anterior cervical decompression/discectomy fusion on 02/28/2014 by Dr. Kathyrn Sheriff who presents to the ED complaining of worsening posterior neck pain since last night. The patient reports that last night he started having worsening posterior neck pain that radiates into his bilateral shoulders. He rates his pain at an 8 out of 10 and describes it as sharp. He denies numbness, tingling or weakness. He reports taking Percocet and Flexeril as prescribed at home without relief. He reports last taking Percocet at 4 AM this morning and Flexeril at 7 AM this morning. Patient was seen here recently and discharged for a wound seroma with pain to his anterior neck which she reports the symptoms resolve. He reports this pain today is new. He denies any changes to his wound or pain over his wound site. He denies fevers, chills, numbness, tingling, weakness, headache, changes to his vision, ear pain, eye pain, sore throat, or trauma to his neck or back recently.  (Consider location/radiation/quality/duration/timing/severity/associated sxs/prior Treatment) HPI  Past Medical History  Diagnosis Date  . No significant past medical history   . Disc degeneration   . Disc degeneration   . Blind right eye   . Chronic back pain   . Degenerative disc disease    Past Surgical History  Procedure Laterality Date  . Degenerative cervical disc    . Fatty tissue Right > 15 yrs. ago     cyst removed on the jaw, at the same time he had a tooth extracted , given heavy sedation, pt. thinks it ws done at surgery center, no anesth. complications that pt. is aware of  . Anterior cervical decompression/discectomy fusion 4 levels N/A 02/28/2014    Procedure: Cervical three-four, Cervical four-five, Cervical  five-six, Cervical six-seven anterior cervical decompression with fusion interbody prosthesis plating and bonegraft;  Surgeon: Consuella Lose, MD;  Location: [place] [facility];  Service: Neurosurgery;  Laterality: N/A;  Cervical three-four, Cervical four-five, Cervical five-six, Cervical six-seven anterior cervical decompression with fusion  . Anterior cervical decomp/discectomy fusion N/A 03/06/2014    Procedure: Exploration of Cervical Wound;  Surgeon: Consuella Lose, MD;  Location[facility];  Service: Neurosurgery;  Laterality: N/A;  . Esophagoscopy  03/06/2014    Procedure: ESOPHAGOSCOPY;  Surgeon: Consuella Lose, MD;  Location: [facility];  Service: Neurosurgery;;   History reviewed. No pertinent family history. History  Substance Use Topics  . Smoking status: Current Every Day Smoker -- 0.50 packs/day for 36 years    Types: Cigarettes  . Smokeless tobacco: Former Systems developer  . Alcohol Use: Yes     Comment: not regular, partner states "3x's yr."    Review of Systems  Constitutional: Negative for fever and chills.  HENT: Negative for congestion, ear pain, sore throat and trouble swallowing.   Eyes: Negative for pain and visual disturbance.  Respiratory: Negative for cough, shortness of breath and wheezing.   Cardiovascular: Negative for chest pain and palpitations.  Gastrointestinal: Negative for nausea, vomiting, abdominal pain and diarrhea.  Genitourinary: Negative for dysuria.  Musculoskeletal: Positive for neck pain. Negative for back pain.  Skin: Negative for rash.  Neurological: Negative for dizziness, syncope, weakness, light-headedness, numbness and headaches.      Allergies  Chicken allergy; Demerol;  Penicillins; and Tramadol  Home Medications   Prior to Admission medications   Medication Sig Start Date End Date Taking? Authorizing Provider  calcium carbonate (TUMS EX) 750 MG chewable tablet Chew 2 tablets by mouth daily as needed for heartburn.   Yes Historical  Provider, MD  cyclobenzaprine (FLEXERIL) 10 MG tablet Take 1 tablet (10 mg total) by mouth 3 (three) times daily as needed for muscle spasms. 03/01/14  Yes Consuella Lose, MD  gabapentin (NEURONTIN) 300 MG capsule Take 600 mg by mouth at bedtime.   Yes Historical Provider, MD  indomethacin (INDOCIN) 25 MG capsule Take 1 capsule (25 mg total) by mouth 3 (three) times daily as needed. Patient taking differently: Take 25 mg by mouth 3 (three) times daily as needed for mild pain or moderate pain.  03/14/14  Yes Consuella Lose, MD  meloxicam (MOBIC) 7.5 MG tablet Take 7.5 mg by mouth 2 (two) times daily as needed for pain.    Yes Historical Provider, MD  oxyCODONE-acetaminophen (PERCOCET/ROXICET) 5-325 MG per tablet Take 1-2 tablets by mouth every 4 (four) hours as needed for moderate pain. 03/01/14  Yes Consuella Lose, MD  diazepam (VALIUM) 5 MG tablet Take 1 tablet (5 mg total) by mouth 2 (two) times daily. 03/10/14   Waynetta Pean, PA-C  HYDROmorphone (DILAUDID) 2 MG tablet Take 1 tablet (2 mg total) by mouth every 4 (four) hours as needed for severe pain. 03/10/14   Waynetta Pean, PA-C  HYDROmorphone (DILAUDID) 4 MG tablet Take 1 tablet (4 mg total) by mouth every 4 (four) hours as needed. 03/10/14   Noemi Chapel, MD  methylPREDNIsolone (MEDROL DOSPACK) 4 MG tablet follow package directions 03/10/14   Waynetta Pean, PA-C   BP 134/86 mmHg  Pulse 71  Temp(Src) 98 F (36.7 C) (Oral)  Resp 18  Ht 5\' 6"  (1.676 m)  Wt 210 lb (95.255 kg)  BMI 33.91 kg/m2  SpO2 99% Physical Exam  Constitutional: He is oriented to person, place, and time. He appears well-developed and well-nourished. No distress.  Non-toxic appearing. Patient is wearing c-collar on arrival.   HENT:  Head: Normocephalic and atraumatic.  Right Ear: External ear normal.  Left Ear: External ear normal.  Nose: Nose normal.  Mouth/Throat: Oropharynx is clear and moist. No oropharyngeal exudate.  Eyes: Conjunctivae and EOM are normal.  Pupils are equal, round, and reactive to light. Right eye exhibits no discharge. Left eye exhibits no discharge.  Neck: Neck supple. No JVD present.  Neck is non-tender to palpation. He has a healing surgical incision to his anterior neck that appears clean and without discharge. Anterior neck is non-tender.   Cardiovascular: Normal rate, regular rhythm, normal heart sounds and intact distal pulses.  Exam reveals no gallop and no friction rub.   No murmur heard. Bilateral radial pulses are intact.   Pulmonary/Chest: Effort normal and breath sounds normal. No respiratory distress. He has no wheezes. He has no rales. He exhibits no tenderness.  Abdominal: Soft. There is no tenderness.  Musculoskeletal: He exhibits no edema.  Strength is 5/5 in his bilateral upper extremities.   Lymphadenopathy:    He has no cervical adenopathy.  Neurological: He is alert and oriented to person, place, and time. No cranial nerve deficit. Coordination normal.  Sensation is intact in his bilateral upper extremities. Good and equal grip strength bilaterally. Finger to nose is intact bilaterally. He has pain with range of motion of his shoulders. EOMs intact.   Skin: Skin is warm and dry. No  rash noted. He is not diaphoretic. No erythema. No pallor.  Psychiatric: He has a normal mood and affect. His behavior is normal.  Nursing note and vitals reviewed.   ED Course  Procedures (including critical care time) Labs Review Labs Reviewed  BASIC METABOLIC PANEL - Abnormal; Notable for the following:    Anion gap 4 (*)    All other components within normal limits  CBC WITH DIFFERENTIAL/PLATELET    Imaging Review No results found.   EKG Interpretation None      Filed Vitals:   03/10/14 1115 03/10/14 1145 03/10/14 1211 03/10/14 1230  BP: 138/80 133/85 123/81 134/86  Pulse: 67 71 64 71  Temp:      TempSrc:      Resp:      Height:      Weight:      SpO2: 99% 98% 99% 99%     MDM   Meds given in  ED:  Medications  HYDROmorphone (DILAUDID) injection 1 mg (1 mg Intravenous Given 03/10/14 0917)  HYDROmorphone (DILAUDID) injection 1 mg (1 mg Intravenous Given 03/10/14 0947)  HYDROmorphone (DILAUDID) injection 1 mg (1 mg Intravenous Given 03/10/14 1212)  diazepam (VALIUM) tablet 5 mg (5 mg Oral Given 03/10/14 1240)    Discharge Medication List as of 03/10/2014 12:47 PM    START taking these medications   Details  diazepam (VALIUM) 5 MG tablet Take 1 tablet (5 mg total) by mouth 2 (two) times daily., Starting 03/10/2014, Until Discontinued, Print    HYDROmorphone (DILAUDID) 2 MG tablet Take 1 tablet (2 mg total) by mouth every 4 (four) hours as needed for severe pain., Starting 03/10/2014, Until Discontinued, Print    methylPREDNIsolone (MEDROL DOSPACK) 4 MG tablet follow package directions, Print        Final diagnoses:  Post-operative pain  Neck pain   This is a 49 y.o. male who is s/p anterior cervical decompression/discectomy fusion on 02/28/2014 by Dr. Kathyrn Sheriff who presents to the ED complaining of worsening posterior neck pain since last night. The patient reports that last night he started having worsening posterior neck pain that radiates into his bilateral shoulders. He was treated for a wound seroma last week and reports this pain has resolved. The patient is afebrile and nontoxic appearing. His anterior neck surgical incision is healing well without signs of infection. He is neurologically intact. His BMP and CBC are unremarkable. I called and spoke with neurosurgeon Dr. Arnoldo Morale who reports he will come and see the patient and that this is likely just post operative pain. Dr Arnoldo Morale saw the patient and would like him sent home with dilaudid, valium and medrol dose pack and have him follow up in office next week. I advised the patient to follow-up with their primary care provider this week. I advised the patient to return to the emergency department with new or worsening symptoms or new  concerns. The patient verbalized understanding and agreement with plan.   This patient was discussed with and evaluated by Dr. Sabra Heck who agrees with assessment and plan.     Waynetta Pean, PA-C 03/10/14 1650  Noemi Chapel, MD 03/10/14 [phone]

## 2014-03-10 NOTE — ED Provider Notes (Signed)
49 year old male, recent anterior approach cervical fusion, had postoperative seroma that required repeat operation, has been doing well with regards to that, no discharge, no redness, no fevers but has had persistent neck pain which is posterior, the patient states this is the way that his postoperative pain has been since surgery back in February, he has been taking his home pain medications with only minimal relief. On exam the patient has good healing wounds, is maintaining his cervical collar, has some tenderness around the cervical muscles, neurologic exam is totally normal with strength sensation and cranial nerves III through XII. Cardiac and pulmonary exams are normal, no tachycardia, no fevers, check blood counts, discussed with neurosurgery, pain control.  The patient has been seen by neurosurgery, appreciate Dr. Arnoldo Morale input, requests that we write home pain medications, will give Dilaudid at Dr. Arnoldo Morale request, 50 tablet Rx given  Medical screening examination/treatment/procedure(s) were conducted as a shared visit with non-physician practitioner(s) and myself.  I personally evaluated the patient during the encounter.  Clinical Impression:   Final diagnoses:  Post-operative pain  Neck pain         Noemi Chapel, MD 03/10/14 2047

## 2014-03-10 NOTE — Discharge Instructions (Signed)
Pain Relief Preoperatively and Postoperatively °Being a good patient does not mean being a silent one. If you have questions, problems, or concerns about the pain you may feel after surgery, let your caregiver know. Patients have the right to assessment and management of pain. The treatment of pain after surgery is important to speed up recovery and return to normal activities. Severe pain after surgery, and the fear or anxiety associated with that pain, may cause extreme discomfort that: °· Prevents sleep. °· Decreases the ability to breathe deeply and cough. This can cause pneumonia or other upper airway infections. °· Causes your heart to beat faster and your blood pressure to be higher. °· Increases the risk for constipation and bloating. °· Decreases the ability of wounds to heal. °· May result in depression, increased anxiety, and feelings of helplessness. °Relief of pain before surgery is also important because it will lessen the pain after surgery. Patients who receive both pain relief before and after surgery experience greater pain relief than those who only receive pain relief after surgery. Let your caregiver know if you are having uncontrolled pain. This is very important. Pain after surgery is more difficult to manage if it is permitted to become severe, so prompt and adequate treatment of acute pain is necessary. °PAIN CONTROL METHODS °Your caregivers follow policies and procedures about the management of patient pain. These guidelines should be explained to you before surgery. Plans for pain control after surgery must be mutually decided upon and instituted with your full understanding and agreement. Do not be afraid to ask questions regarding the care you are receiving. There are many different ways your caregivers will attempt to control your pain, including the following methods. °As needed pain control °· You may be given pain medicine either through your intravenous (IV) tube, or as a pill or  liquid you can swallow. You will need to let your caregiver know when you are having pain. Then, your caregiver will give you the pain medicine ordered for you. °· Your pain medicine may make you constipated. If constipation occurs, drink more liquids if you can. Your caregiver may have you take a mild laxative. °IV patient-controlled analgesia pump (PCA pump) °· You can get your pain medicine through the IV tube which goes into your vein. You are able to control the amount of pain medicine that you get. The pain medicine flows in through an IV tube and is controlled by a pump. This pump gives you a set amount of pain medicine when you push the button hooked up to it. Nobody should push this button but you or someone specifically assigned by you to do so. It is set up to keep you from accidentally giving yourself too much pain medicine. You will be able to start using your pain pump in the recovery room after your surgery. This method can be helpful for most types of surgery. °· If you are still having too much pain, tell your caregiver. Also, tell your caregiver if you are feeling too sleepy or nauseous. °Continuous epidural pain control °· A thin, soft tube (catheter) is put into your back. Pain medicine flows through the catheter to lessen pain in the part of your body where the surgery is done. Continuous epidural pain control may work best for you if you are having surgery on your chest, abdomen, hip area, or legs. The epidural catheter is usually put into your back just before surgery. The catheter is left in until you can eat and take medicine by mouth. In most cases,   this may take 2 to 3 days. °· Giving pain medicine through the epidural catheter may help you heal faster because: °¨ Your bowel gets back to normal faster. °¨ You can get back to eating sooner. °¨ You can be up and walking sooner. °Medicine that numbs the area (local anesthetic) °· You may receive an injection of pain medicine near where the  pain is (local infiltration). °· You may receive an injection of pain medicine near the nerve that controls the sensation to a specific part of the body (peripheral nerve block). °· Medicine may be put in the spine to block pain (spinal block). °Opioids °· Moderate to moderately severe acute pain after surgery may respond to opioids. Opioids are narcotic pain medicine. Opioids are often combined with non-narcotic medicines to improve pain relief, diminish the risk of side effects, and reduce the chance of addiction. °· If you follow your caregiver's directions about taking opioids and you do not have a history of substance abuse, your risk of becoming addicted is exceptionally small. Opioids are given for short periods of time in careful doses to prevent addiction. °Other methods of pain control include: °· Steroids. °· Physical therapy. °· Heat and cold therapy. °· Compression, such as wrapping an elastic bandage around the area of pain. °· Massage. °These various ways of controlling pain may be used together. Combining different methods of pain control is called multimodal analgesia. Using this approach has many benefits, including being able to eat, move around, and leave the hospital sooner. °Document Released: 03/12/2002 Document Revised: 03/14/2011 Document Reviewed: 03/16/2010 °[employer]® Patient Information ©2015 [employer]. This information is not intended to replace advice given to you by your health care provider. Make sure you discuss any questions you have with your health care provider. ° °

## 2014-03-10 NOTE — ED Notes (Signed)
Pt presents to department for evaluation of neck pain. Recent neck surgery 02/28/14, was seen shortly after in ED due to fluid build up at site, had drain placed. Now reports increased pain to neck this morning. No relief with pain medications at home.

## 2014-03-11 LAB — ANAEROBIC CULTURE

## 2014-04-18 LAB — AFB CULTURE WITH SMEAR (NOT AT ARMC): Acid Fast Smear: NONE SEEN

## 2015-01-13 IMAGING — CR DG SHOULDER 2+V*R*
3 series · 3 of 3 positions shown · non-contrast
Comparison: None.

CLINICAL DATA: Pain without trauma.

RIGHT SHOULDER - 2+ VIEW

[w shoulder ap internal righ]
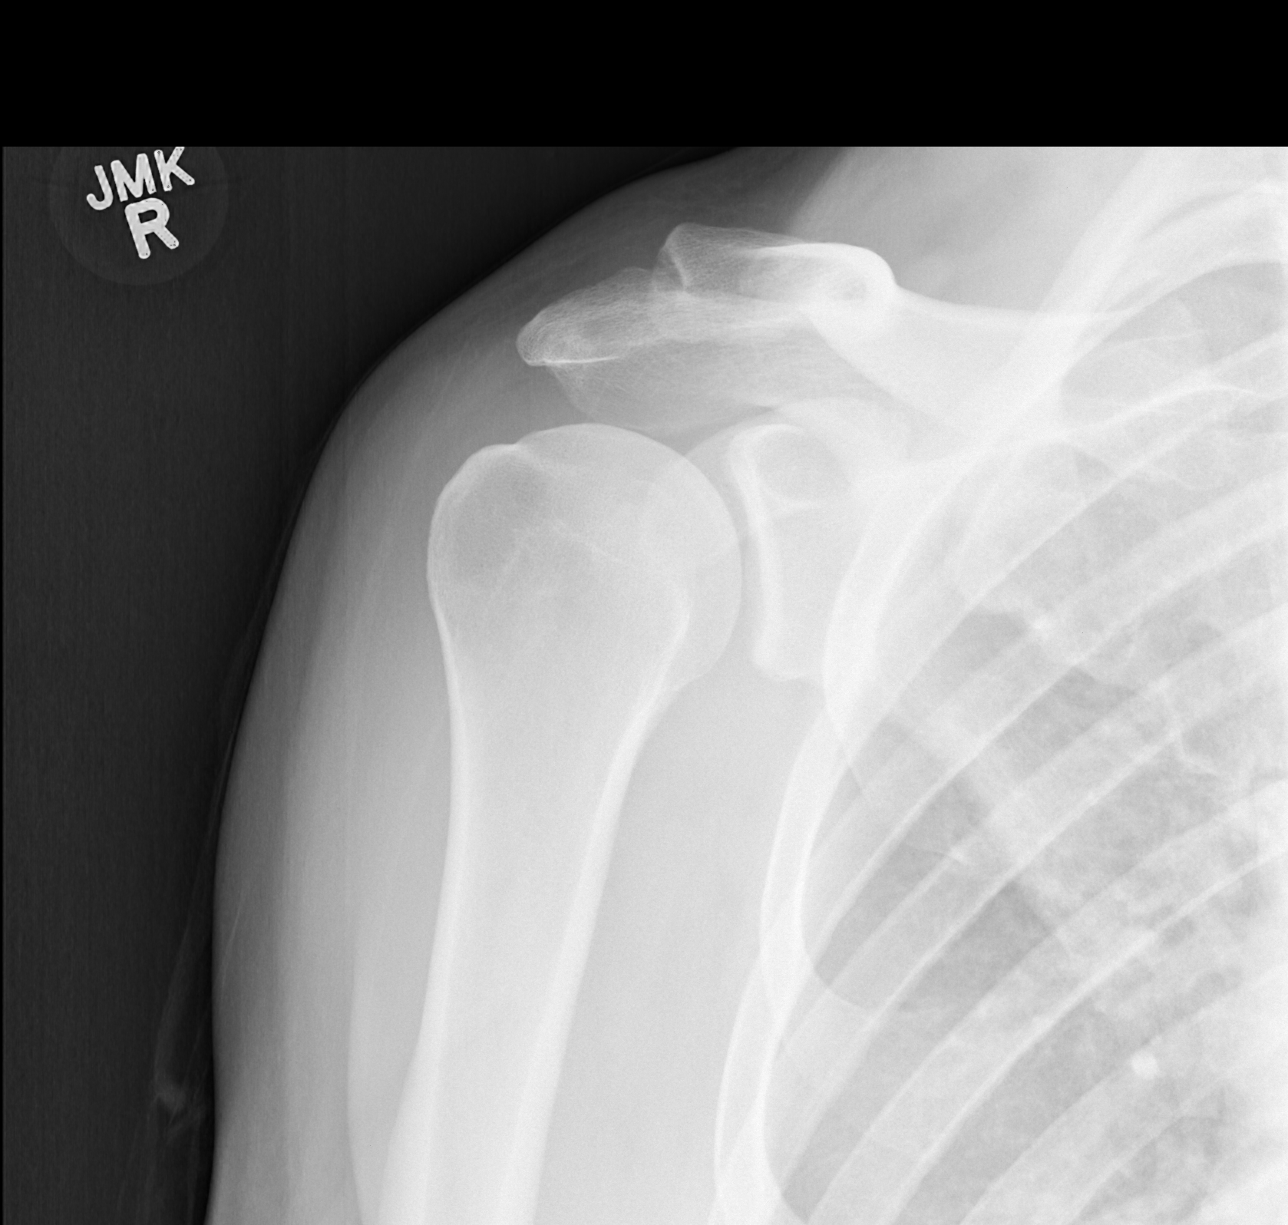

[w shoulder y view right]
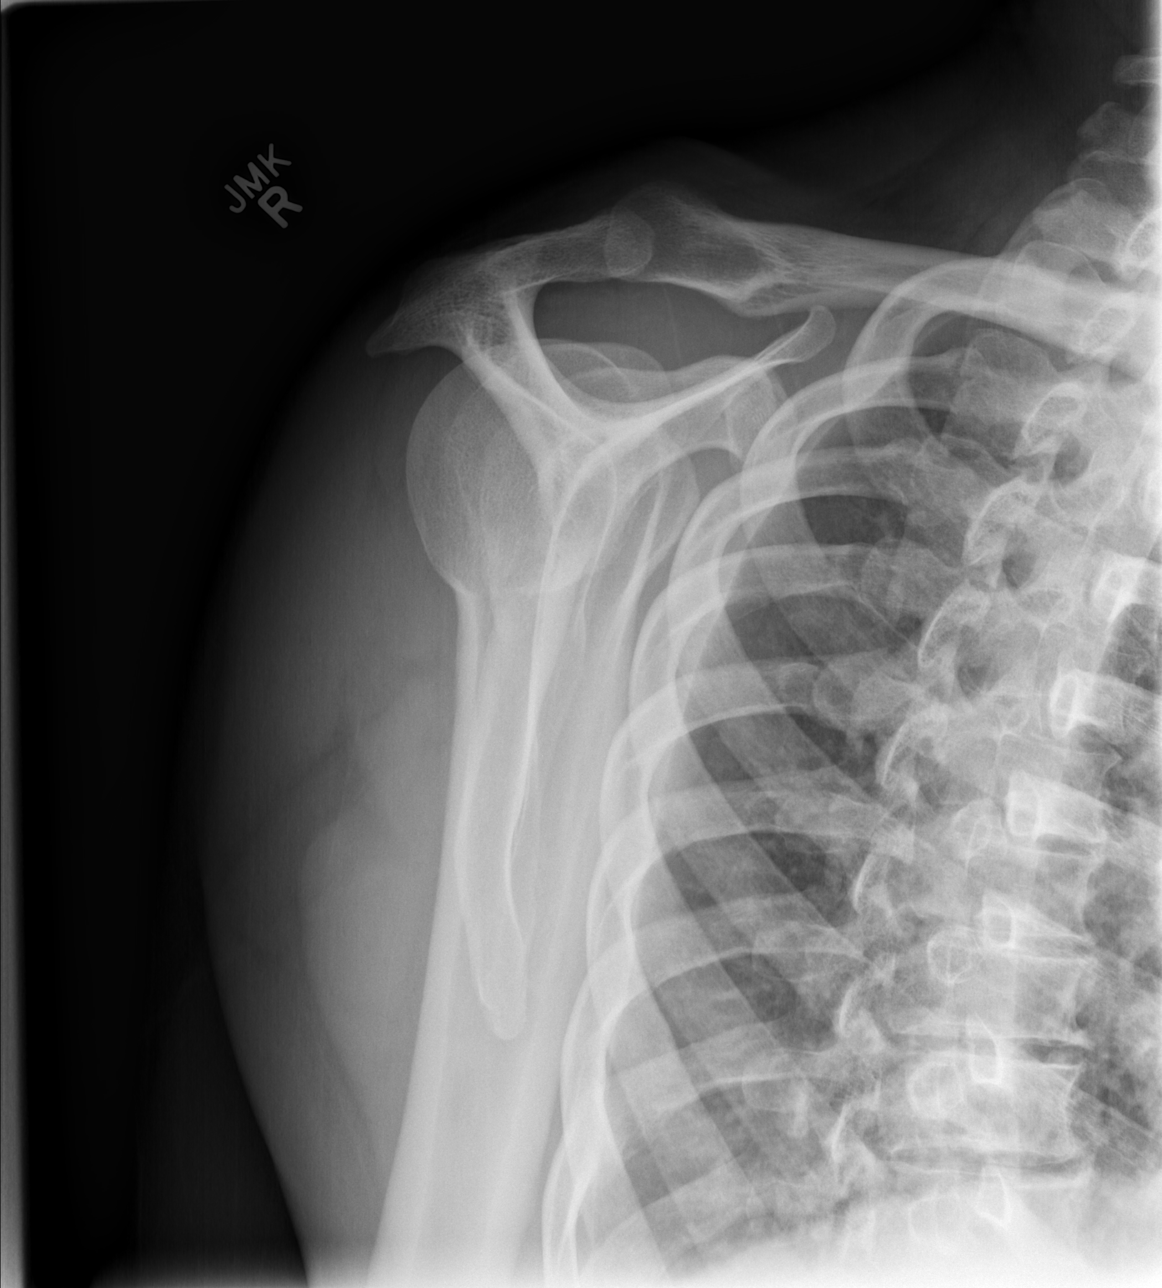

[x shoulder axillary right]
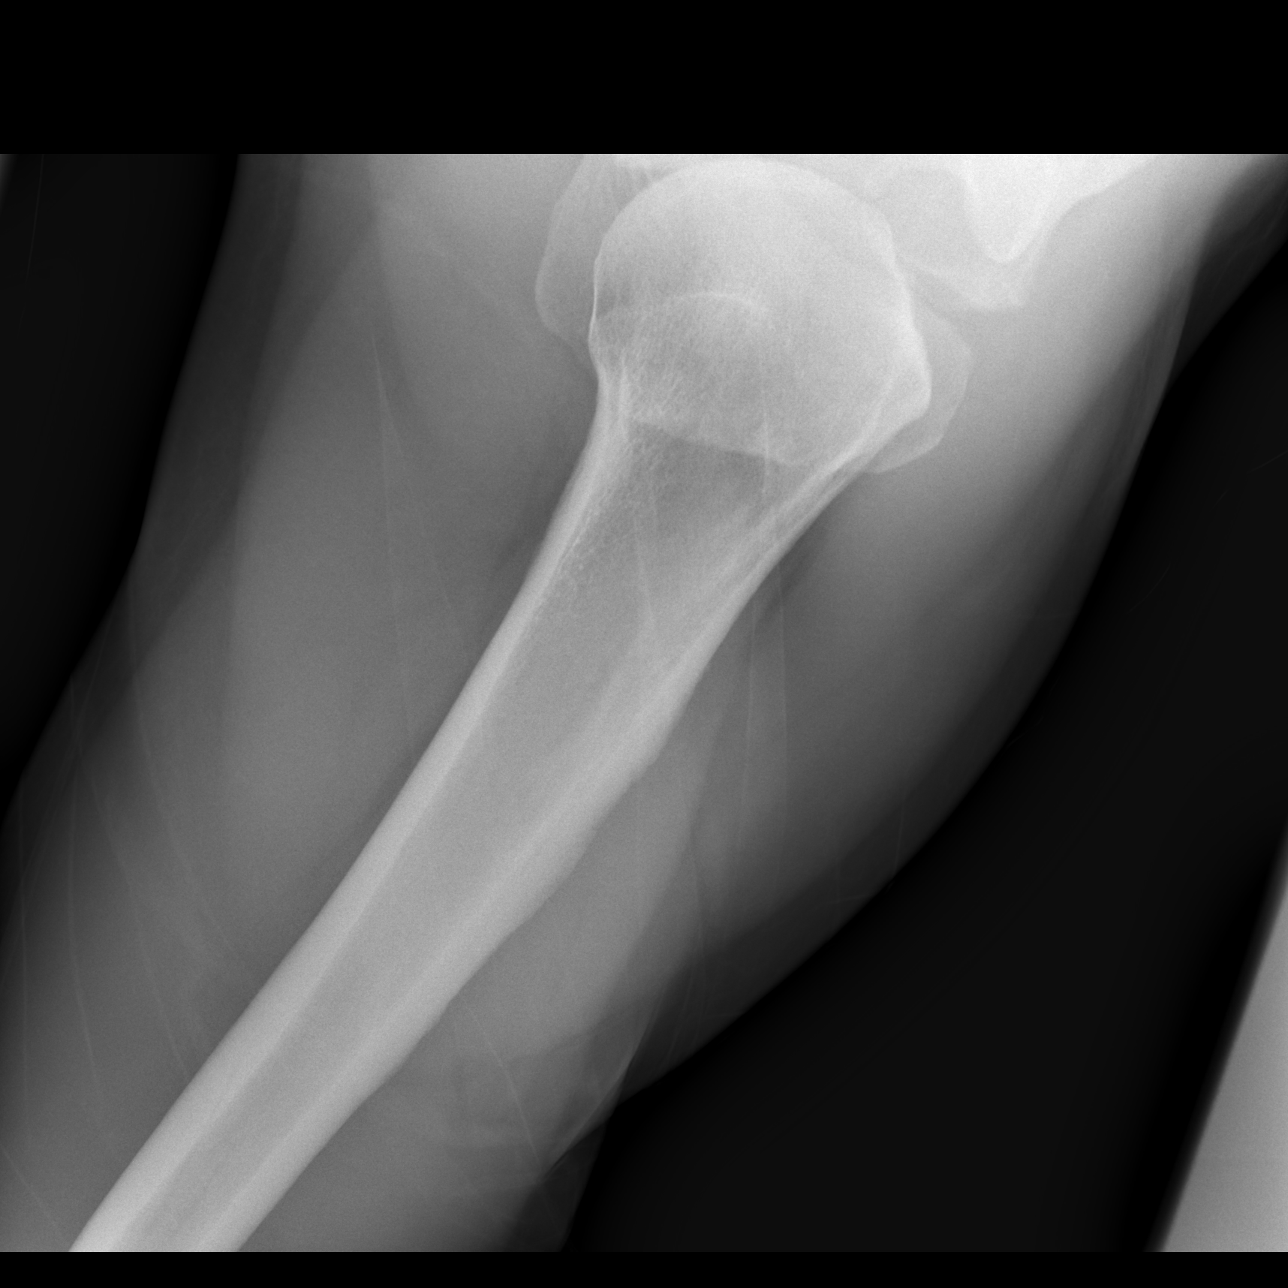

[3 of 3 positions shown; findings below may reference images not displayed]

FINDINGS: Mild degenerative regularity of the undersurface of the
distal clavicle.  Visualized portion of the right hemithorax is
normal.
IMPRESSION: Mild acromioclavicular joint osteoarthritis. No acute findings.

## 2015-06-14 ENCOUNTER — Encounter (HOSPITAL_COMMUNITY): Payer: Self-pay | Admitting: *Deleted

## 2015-06-14 ENCOUNTER — Emergency Department (HOSPITAL_COMMUNITY)
Admission: EM | Admit: 2015-06-14 | Discharge: 2015-06-14 | Disposition: A | Discharge status: Home / Self Care | Payer: Medicare Other | Attending: Emergency Medicine | Admitting: Emergency Medicine

## 2015-06-14 DIAGNOSIS — H6593 Unspecified nonsuppurative otitis media, bilateral: Secondary | ICD-10-CM | POA: Diagnosis not present

## 2015-06-14 DIAGNOSIS — Z79899 Other long term (current) drug therapy: Secondary | ICD-10-CM | POA: Insufficient documentation

## 2015-06-14 DIAGNOSIS — F17210 Nicotine dependence, cigarettes, uncomplicated: Secondary | ICD-10-CM | POA: Insufficient documentation

## 2015-06-14 DIAGNOSIS — H9201 Otalgia, right ear: Secondary | ICD-10-CM | POA: Diagnosis present

## 2015-06-14 MED ORDER — AMOXICILLIN-POT CLAVULANATE 875-125 MG PO TABS: 1.0000 | ORAL_TABLET | Freq: Two times a day (BID) | ORAL | Status: DC

## 2015-06-14 MED ORDER — FLUTICASONE PROPIONATE 50 MCG/ACT NA SUSP: 1.0000 | Freq: Every day | NASAL | Status: AC

## 2015-06-14 NOTE — Discharge Instructions (Signed)
Otitis Media, Adult Otitis media is redness, soreness, and puffiness (swelling) in the space just behind your eardrum (middle ear). It may be caused by allergies or infection. It often happens along with a cold. HOME CARE  Take your medicine as told. Finish it even if you start to feel better.  Only take over-the-counter or prescription medicines for pain, discomfort, or fever as told by your doctor.  Follow up with your doctor as told. GET HELP IF:  You have otitis media only in one ear, or bleeding from your nose, or both.  You notice a lump on your neck.  You are not getting better in 3-5 days.  You feel worse instead of better. GET HELP RIGHT AWAY IF:   You have pain that is not helped with medicine.  You have puffiness, redness, or pain around your ear.  You get a stiff neck.  You cannot move part of your face (paralysis).  You notice that the bone behind your ear hurts when you touch it. MAKE SURE YOU:   Understand these instructions.  Will watch your condition.  Will get help right away if you are not doing well or get worse.   This information is not intended to replace advice given to you by your health care provider. Make sure you discuss any questions you have with your health care provider.   Discontinue home antibiotics. Take Augmentin as prescribed. Continue using Sudafed as well as Flonase. Follow up with your primary care provider this week if your symptoms do not improve. Return to the ED if you experience severe worsening of your symptoms, severe headache, pain in the bone behind your ear, fever, chills, difficulty breathing.

## 2015-06-14 NOTE — ED Notes (Signed)
The pt has stopped up ears since thursday

## 2015-06-14 NOTE — ED Provider Notes (Signed)
CSN: [id number]     Arrival date & time 06/14/15  0505 History   First MD Initiated Contact with Patient 06/14/15 0557     Chief Complaint  Patient presents with  . Ear Fullness     (Consider location/radiation/quality/duration/timing/severity/associated sxs/prior Treatment) HPI   Timothy Boone is a 50 y.o M with no significant pmhx who presents to the ED today c/o Right-sided ear fullness. Patient states that he has been struggling with a sinus infection for 1 month. He states initially his symptoms consisted of sinus pain and pressure, rhinorrhea and cough. He was initially placed on amoxicillin which he states did not improve his symptoms. 3 days ago patient developed fullness and pain in his right ear as well as decreased hearing. Patient states that his left ear also intermittently has pain and he feels like it is having the initial symptoms of feeling full but does not quite feel as bad as his right ear. He was seen at [facility] and was given Levaquin for the symptoms which he states has provided him no relief. Patient states he has been taking Sudafed which seemed to help his sinus pain and pressure however the ear fullness has remained. Patient denies neck pain/stiffness, fevers, chills, sore throat, pain in the bone behind his ear.  Past Medical History  Diagnosis Date  . No significant past medical history   . Disc degeneration   . Disc degeneration   . Blind right eye   . Chronic back pain   . Degenerative disc disease    Past Surgical History  Procedure Laterality Date  . Degenerative cervical disc    . Fatty tissue Right > 15 yrs. ago     cyst removed on the jaw, at the same time he had a tooth extracted , given heavy sedation, pt. thinks it ws done at surgery center, no anesth. complications that pt. is aware of  . Anterior cervical decompression/discectomy fusion 4 levels N/A 02/28/2014    Procedure: Cervical three-four, Cervical four-five, Cervical five-six,  Cervical six-seven anterior cervical decompression with fusion interbody prosthesis plating and bonegraft;  Surgeon: Consuella Lose, MD;  Location: [place] [facility];  Service: Neurosurgery;  Laterality: N/A;  Cervical three-four, Cervical four-five, Cervical five-six, Cervical six-seven anterior cervical decompression with fusion  . Anterior cervical decomp/discectomy fusion N/A 03/06/2014    Procedure: Exploration of Cervical Wound;  Surgeon: Consuella Lose, MD;  Location: [place] [facility];  Service: Neurosurgery;  Laterality: N/A;  . Esophagoscopy  03/06/2014    Procedure: ESOPHAGOSCOPY;  Surgeon: Consuella Lose, MD;  Location: [facility];  Service: Neurosurgery;;   No family history on file. Social History  Substance Use Topics  . Smoking status: Current Every Day Smoker -- 0.50 packs/day for 36 years    Types: Cigarettes  . Smokeless tobacco: Former Systems developer  . Alcohol Use: Yes     Comment: not regular, partner states "3x's yr."    Review of Systems  All other systems reviewed and are negative.     Allergies  Chicken allergy; Demerol; Penicillins; and Tramadol  Home Medications   Prior to Admission medications   Medication Sig Start Date End Date Taking? Authorizing Provider  amoxicillin-clavulanate (AUGMENTIN) 875-125 MG tablet Take 1 tablet by mouth every 12 (twelve) hours. 06/14/15   Mickie Badders Tripp Anthone Prieur, PA-C  calcium carbonate (TUMS EX) 750 MG chewable tablet Chew 2 tablets by mouth daily as needed for heartburn.    Historical Provider, MD  cyclobenzaprine (FLEXERIL) 10 MG tablet Take  1 tablet (10 mg total) by mouth 3 (three) times daily as needed for muscle spasms. 03/01/14   Consuella Lose, MD  diazepam (VALIUM) 5 MG tablet Take 1 tablet (5 mg total) by mouth 2 (two) times daily. 03/10/14   Waynetta Pean, PA-C  fluticasone (FLONASE) 50 MCG/ACT nasal spray Place 1 spray into both nostrils daily. 06/14/15   Casmer Yepiz Tripp Remijio Holleran, PA-C  gabapentin (NEURONTIN) 300 MG capsule  Take 600 mg by mouth at bedtime.    Historical Provider, MD  HYDROmorphone (DILAUDID) 2 MG tablet Take 1 tablet (2 mg total) by mouth every 4 (four) hours as needed for severe pain. 03/10/14   Waynetta Pean, PA-C  HYDROmorphone (DILAUDID) 4 MG tablet Take 1 tablet (4 mg total) by mouth every 4 (four) hours as needed. 03/10/14   Noemi Chapel, MD  indomethacin (INDOCIN) 25 MG capsule Take 1 capsule (25 mg total) by mouth 3 (three) times daily as needed. Patient taking differently: Take 25 mg by mouth 3 (three) times daily as needed for mild pain or moderate pain.  03/14/14   Consuella Lose, MD  meloxicam (MOBIC) 7.5 MG tablet Take 7.5 mg by mouth 2 (two) times daily as needed for pain.     Historical Provider, MD  methylPREDNIsolone (MEDROL DOSPACK) 4 MG tablet follow package directions 03/10/14   Waynetta Pean, PA-C  oxyCODONE-acetaminophen (PERCOCET/ROXICET) 5-325 MG per tablet Take 1-2 tablets by mouth every 4 (four) hours as needed for moderate pain. 03/01/14   Consuella Lose, MD   BP 123/77 mmHg  Pulse 71  Temp(Src) 97.3 F (36.3 C) (Oral)  Resp 17  Ht 6' (1.829 m)  Wt 97.07 kg  BMI 29.02 kg/m2  SpO2 96% Physical Exam  Constitutional: He is oriented to person, place, and time. He appears well-developed and well-nourished. No distress.  HENT:  Head: Normocephalic and atraumatic.  Right Ear: Tympanic membrane is erythematous and retracted. A middle ear effusion is present.  Left Ear: Tympanic membrane is erythematous.  Nose: Nose normal.  Mouth/Throat: Oropharynx is clear and moist. No oropharyngeal exudate.  Eyes: Conjunctivae are normal. Right eye exhibits no discharge. Left eye exhibits no discharge. No scleral icterus.  Cardiovascular: Normal rate.   Pulmonary/Chest: Effort normal and breath sounds normal.  Neurological: He is alert and oriented to person, place, and time. Coordination normal.  Skin: Skin is warm and dry. No rash noted. He is not diaphoretic. No erythema. No  pallor.  Psychiatric: He has a normal mood and affect. His behavior is normal.  Nursing note and vitals reviewed.   ED Course  Procedures (including critical care time) Labs Review Labs Reviewed - No data to display  Imaging Review No results found. I have personally reviewed and evaluated these images and lab results as part of my medical decision-making.   EKG Interpretation None      MDM   Final diagnoses:  Otitis media with effusion, bilateral    Patient presents with exam and hx c/w OME. Right TM is erythematous and retracted. No concern for acute mastoiditis, meningitis. Do not feel that Levaquin is appropriate for pt condition. Recommend d/c this mediation and switch to Augmentin. Continue taking Sudafed. Will also give rx for Flonase. Recommend f/u with PCP this week if symptoms do not improve.  I have also discussed reasons to return immediately to the ER.  Pt expresses understanding and agrees with plan.       Dondra Spry Pickering, PA-C [id number]  Delora Fuel, MD [id number] [phone]

## 2015-10-17 DIAGNOSIS — M25571 Pain in right ankle and joints of right foot: Secondary | ICD-10-CM | POA: Diagnosis not present

## 2015-11-03 DIAGNOSIS — Z79899 Other long term (current) drug therapy: Secondary | ICD-10-CM | POA: Diagnosis not present

## 2015-11-03 DIAGNOSIS — E785 Hyperlipidemia, unspecified: Secondary | ICD-10-CM | POA: Diagnosis not present

## 2015-11-03 DIAGNOSIS — M1A0720 Idiopathic chronic gout, left ankle and foot, without tophus (tophi): Secondary | ICD-10-CM | POA: Diagnosis not present

## 2015-11-03 DIAGNOSIS — R0602 Shortness of breath: Secondary | ICD-10-CM | POA: Diagnosis not present

## 2015-11-03 DIAGNOSIS — Z23 Encounter for immunization: Secondary | ICD-10-CM | POA: Diagnosis not present

## 2015-11-03 DIAGNOSIS — R350 Frequency of micturition: Secondary | ICD-10-CM | POA: Diagnosis not present

## 2015-12-02 DIAGNOSIS — Z720 Tobacco use: Secondary | ICD-10-CM | POA: Diagnosis not present

## 2015-12-02 DIAGNOSIS — R350 Frequency of micturition: Secondary | ICD-10-CM | POA: Diagnosis not present

## 2015-12-02 DIAGNOSIS — Z79899 Other long term (current) drug therapy: Secondary | ICD-10-CM | POA: Diagnosis not present

## 2015-12-02 DIAGNOSIS — E785 Hyperlipidemia, unspecified: Secondary | ICD-10-CM | POA: Diagnosis not present

## 2015-12-02 DIAGNOSIS — Z1211 Encounter for screening for malignant neoplasm of colon: Secondary | ICD-10-CM | POA: Diagnosis not present

## 2016-02-09 DIAGNOSIS — K219 Gastro-esophageal reflux disease without esophagitis: Secondary | ICD-10-CM | POA: Diagnosis not present

## 2016-02-09 DIAGNOSIS — Z79899 Other long term (current) drug therapy: Secondary | ICD-10-CM | POA: Diagnosis not present

## 2016-02-09 DIAGNOSIS — M1A0720 Idiopathic chronic gout, left ankle and foot, without tophus (tophi): Secondary | ICD-10-CM | POA: Diagnosis not present

## 2016-02-09 DIAGNOSIS — E785 Hyperlipidemia, unspecified: Secondary | ICD-10-CM | POA: Diagnosis not present

## 2016-02-09 DIAGNOSIS — R1310 Dysphagia, unspecified: Secondary | ICD-10-CM | POA: Diagnosis not present

## 2016-02-17 DIAGNOSIS — Z1211 Encounter for screening for malignant neoplasm of colon: Secondary | ICD-10-CM | POA: Diagnosis not present

## 2016-03-11 DIAGNOSIS — K635 Polyp of colon: Secondary | ICD-10-CM | POA: Diagnosis not present

## 2016-03-11 DIAGNOSIS — K210 Gastro-esophageal reflux disease with esophagitis: Secondary | ICD-10-CM | POA: Diagnosis not present

## 2016-03-11 DIAGNOSIS — D122 Benign neoplasm of ascending colon: Secondary | ICD-10-CM | POA: Diagnosis not present

## 2016-03-11 DIAGNOSIS — K222 Esophageal obstruction: Secondary | ICD-10-CM | POA: Diagnosis not present

## 2016-03-11 DIAGNOSIS — Z86010 Personal history of colonic polyps: Secondary | ICD-10-CM | POA: Diagnosis not present

## 2016-03-11 DIAGNOSIS — R1310 Dysphagia, unspecified: Secondary | ICD-10-CM | POA: Diagnosis not present

## 2016-03-11 DIAGNOSIS — Z1211 Encounter for screening for malignant neoplasm of colon: Secondary | ICD-10-CM | POA: Diagnosis not present

## 2016-03-11 DIAGNOSIS — D126 Benign neoplasm of colon, unspecified: Secondary | ICD-10-CM | POA: Diagnosis not present

## 2016-04-06 DIAGNOSIS — D126 Benign neoplasm of colon, unspecified: Secondary | ICD-10-CM | POA: Diagnosis not present

## 2016-05-11 DIAGNOSIS — Z1389 Encounter for screening for other disorder: Secondary | ICD-10-CM | POA: Diagnosis not present

## 2016-05-11 DIAGNOSIS — R0602 Shortness of breath: Secondary | ICD-10-CM | POA: Diagnosis not present

## 2016-05-11 DIAGNOSIS — M1A0720 Idiopathic chronic gout, left ankle and foot, without tophus (tophi): Secondary | ICD-10-CM | POA: Diagnosis not present

## 2016-05-11 DIAGNOSIS — Z79899 Other long term (current) drug therapy: Secondary | ICD-10-CM | POA: Diagnosis not present

## 2016-05-11 DIAGNOSIS — K219 Gastro-esophageal reflux disease without esophagitis: Secondary | ICD-10-CM | POA: Diagnosis not present

## 2016-05-11 DIAGNOSIS — E785 Hyperlipidemia, unspecified: Secondary | ICD-10-CM | POA: Diagnosis not present

## 2016-07-18 ENCOUNTER — Emergency Department (HOSPITAL_COMMUNITY)
Admission: EM | Admit: 2016-07-18 | Discharge: 2016-07-18 | Disposition: A | Discharge status: Home / Self Care | Payer: Medicare Other | Attending: Emergency Medicine | Admitting: Emergency Medicine

## 2016-07-18 ENCOUNTER — Encounter (HOSPITAL_COMMUNITY): Payer: Self-pay | Admitting: Emergency Medicine

## 2016-07-18 DIAGNOSIS — Z79899 Other long term (current) drug therapy: Secondary | ICD-10-CM | POA: Insufficient documentation

## 2016-07-18 DIAGNOSIS — M545 Low back pain: Secondary | ICD-10-CM | POA: Insufficient documentation

## 2016-07-18 DIAGNOSIS — G8929 Other chronic pain: Secondary | ICD-10-CM | POA: Insufficient documentation

## 2016-07-18 DIAGNOSIS — F17210 Nicotine dependence, cigarettes, uncomplicated: Secondary | ICD-10-CM | POA: Diagnosis not present

## 2016-07-18 MED ORDER — IBUPROFEN 800 MG PO TABS: 800.0000 mg | ORAL_TABLET | Freq: Once | ORAL | Status: DC

## 2016-07-18 MED ORDER — MELOXICAM 7.5 MG PO TABS: 7.5000 mg | ORAL_TABLET | Freq: Every day | ORAL | 0 refills | Status: DC

## 2016-07-18 MED ORDER — MELOXICAM 15 MG PO TABS
15.0000 mg | ORAL_TABLET | Freq: Once | ORAL | Status: AC
  Administered 2016-07-18: 15 mg via ORAL
  Filled 2016-07-18 (×2): qty 1

## 2016-07-18 MED ORDER — CYCLOBENZAPRINE HCL 10 MG PO TABS: 10.0000 mg | ORAL_TABLET | Freq: Every evening | ORAL | 0 refills | Status: DC | PRN

## 2016-07-18 NOTE — Discharge Instructions (Signed)
Please read instructions below.  Talk with your PCP about any new medications.  You can take Mobic/meloxicam, once daily with food, as needed for pain.  You can take flexeril at bedtime as needed for muscle spasm. Drink plenty of water. Apply ice or heat to your back for additional relief. Return to ER if new numbness or tingling in your arms or legs, inability to urinate, inability to hold your bowels, or weakness in your extremities.

## 2016-07-18 NOTE — ED Provider Notes (Signed)
[facility] DEPT Provider Note   CSN: [id number] Arrival date & time: 07/18/16  1335  By signing my name below, I, Ny'Kea Lewis, attest that this documentation has been prepared under the direction and in the presence of Martinique Russo, PA-C. Electronically Signed: Lise Auer, ED Scribe. 07/18/16. 3:56 PM.  History   Chief Complaint Chief Complaint  Patient presents with  . Back Pain   The history is provided by the patient. No language interpreter was used.    HPI Comments: Timothy Boone is a 51 y.o. male with a history of chronic back pain and DDD, who presents to the Emergency Department complaining of intermittent, gradually worsening acute on chronic lower back pain. Pt notes associated chronic urinary and bowel incontinence, that is not assoc with back pain; no new urinary and bowel sx. Pain is exacerbated with bending, which he describes makes the pain sharp. Denies recent traumatic injuries. No treatment tried. Denies hx of cancer or IVDU. No hx of nephrolithiasis. Denies fever, N/T down extremities, abdominal pain, or dysuria.   Past Medical History:  Diagnosis Date  . Blind right eye   . Chronic back pain   . Degenerative disc disease   . Disc degeneration   . Disc degeneration   . No significant past medical history    Patient Active Problem List   Diagnosis Date Noted  . Postoperative hematoma 03/06/2014  . Cervical spondylosis with radiculopathy 02/28/2014  . No significant past medical history    Past Surgical History:  Procedure Laterality Date  . ANTERIOR CERVICAL DECOMP/DISCECTOMY FUSION N/A 03/06/2014   Procedure: Exploration of Cervical Wound;  Surgeon: Consuella Lose, MD;  Location: [place] [facility];  Service: Neurosurgery;  Laterality: N/A;  . ANTERIOR CERVICAL DECOMPRESSION/DISCECTOMY FUSION 4 LEVELS N/A 02/28/2014   Procedure: Cervical three-four, Cervical four-five, Cervical five-six, Cervical six-seven anterior cervical decompression with fusion  interbody prosthesis plating and bonegraft;  Surgeon: Consuella Lose, MD;  Location: [place];  Service: Neurosurgery;  Laterality: N/A;  Cervical three-four, Cervical four-five, Cervical five-six, Cervical six-seven anterior cervical decompression with fusion  . degenerative cervical disc    . ESOPHAGOSCOPY  03/06/2014   Procedure: ESOPHAGOSCOPY;  Surgeon: Consuella Lose, MD;  Location: [facility];  Service: Neurosurgery;;  . fatty tissue Right > 15 yrs. ago    cyst removed on the jaw, at the same time he had a tooth extracted , given heavy sedation, pt. thinks it ws done at surgery center, no anesth. complications that pt. is aware of    Home Medications    Prior to Admission medications   Medication Sig Start Date End Date Taking? Authorizing Provider  amoxicillin-clavulanate (AUGMENTIN) 875-125 MG tablet Take 1 tablet by mouth every 12 (twelve) hours. 06/14/15   Dowless, Aldona Bar Tripp, PA-C  calcium carbonate (TUMS EX) 750 MG chewable tablet Chew 2 tablets by mouth daily as needed for heartburn.    [provider]  cyclobenzaprine (FLEXERIL) 10 MG tablet Take 1 tablet (10 mg total) by mouth at bedtime as needed for muscle spasms. 07/18/16   Russo, Martinique N, PA-C  diazepam (VALIUM) 5 MG tablet Take 1 tablet (5 mg total) by mouth 2 (two) times daily. 03/10/14   Waynetta Pean, PA-C  fluticasone (FLONASE) 50 MCG/ACT nasal spray Place 1 spray into both nostrils daily. 06/14/15   Dowless, Aldona Bar Tripp, PA-C  gabapentin (NEURONTIN) 300 MG capsule Take 600 mg by mouth at bedtime.    [provider]  HYDROmorphone (DILAUDID) 2 MG tablet Take  1 tablet (2 mg total) by mouth every 4 (four) hours as needed for severe pain. 03/10/14   Waynetta Pean, PA-C  HYDROmorphone (DILAUDID) 4 MG tablet Take 1 tablet (4 mg total) by mouth every 4 (four) hours as needed. 03/10/14   Noemi Chapel, MD  indomethacin (INDOCIN) 25 MG capsule Take 1 capsule (25 mg total) by mouth 3 (three) times  daily as needed. Patient taking differently: Take 25 mg by mouth 3 (three) times daily as needed for mild pain or moderate pain.  03/14/14   Consuella Lose, MD  meloxicam (MOBIC) 7.5 MG tablet Take 1 tablet (7.5 mg total) by mouth daily. 07/18/16   Russo, Martinique N, PA-C  methylPREDNIsolone (MEDROL DOSPACK) 4 MG tablet follow package directions 03/10/14   Waynetta Pean, PA-C  oxyCODONE-acetaminophen (PERCOCET/ROXICET) 5-325 MG per tablet Take 1-2 tablets by mouth every 4 (four) hours as needed for moderate pain. 03/01/14   Consuella Lose, MD   Family History No family history on file.  Social History Social History  Substance Use Topics  . Smoking status: Current Every Day Smoker    Packs/day: 0.50    Years: 36.00    Types: Cigarettes  . Smokeless tobacco: Former Systems developer  . Alcohol use Yes     Comment: not regular, partner states "3x's yr."   Allergies   Chicken allergy; Demerol [meperidine]; Penicillins; and Tramadol  Review of Systems Review of Systems  Constitutional: Negative for fever.  Gastrointestinal: Negative for abdominal pain.  Genitourinary: Negative for dysuria.  Musculoskeletal: Positive for back pain.  Neurological: Negative for numbness.   Physical Exam Updated Vital Signs BP (!) 132/92 (BP Location: Left Arm)   Pulse 80   Temp 98.3 F (36.8 C) (Oral)   Resp 18   Ht 6' (1.829 m)   Wt 102.1 kg (225 lb)   SpO2 98%   BMI 30.52 kg/m   Physical Exam  Constitutional: He is oriented to person, place, and time. He appears well-developed and well-nourished. No distress.  HENT:  Head: Normocephalic and atraumatic.  Eyes: Conjunctivae are normal.  Neck: Normal range of motion.  Cardiovascular: Normal rate and intact distal pulses.   Pulmonary/Chest: Effort normal.  Abdominal: Soft. Bowel sounds are normal. There is no tenderness.  Musculoskeletal:  No spinal or paraspinal tenderness. No bony step-offs, no gross deformities. Mild tenderness to palpation  bilateral lower back. Moving all extremities.  Neurological: He is alert and oriented to person, place, and time. He displays normal reflexes. No cranial nerve deficit or sensory deficit. He exhibits normal muscle tone. Coordination normal.  5/5 strength BLE and BUE. Normal gait.  Psychiatric: He has a normal mood and affect. His behavior is normal.  Nursing note and vitals reviewed.  ED Treatments / Results  DIAGNOSTIC STUDIES: Oxygen Saturation is 98% on RA, normal by my interpretation.   COORDINATION OF CARE: 3:50 PM-Discussed next steps with pt. Pt verbalized understanding and is agreeable with the plan.   Labs (all labs ordered are listed, but only abnormal results are displayed) Labs Reviewed - No data to display  EKG  EKG Interpretation None      Radiology No results found.  Procedures Procedures (including critical care time)  Medications Ordered in ED Medications  meloxicam (MOBIC) tablet 15 mg (not administered)    Initial Impression / Assessment and Plan / ED Course  I have reviewed the triage vital signs and the nursing notes.  Pertinent labs & imaging results that were available during my care of the  patient were reviewed by me and considered in my medical decision making (see chart for details).     Patient with acute on chronic back pain.  No neurological deficits and normal neuro exam.  Patient is ambulatory.  No new loss of bowel or bladder control.  No concern for cauda equina.  No fever, night sweats, weight loss, h/o cancer, IVDA, no recent procedure to back. No urinary symptoms suggestive of UTI.  Supportive care and return precaution discussed. Appears safe for discharge at this time. Follow up as indicated in discharge paperwork.   Discussed results, findings, treatment and follow up. Patient advised of return precautions. Patient verbalized understanding and agreed with plan.  Final Clinical Impressions(s) / ED Diagnoses   Final diagnoses:    Chronic bilateral low back pain without sciatica    New Prescriptions New Prescriptions   CYCLOBENZAPRINE (FLEXERIL) 10 MG TABLET    Take 1 tablet (10 mg total) by mouth at bedtime as needed for muscle spasms.   MELOXICAM (MOBIC) 7.5 MG TABLET    Take 1 tablet (7.5 mg total) by mouth daily.  I personally performed the services described in this documentation, which was scribed in my presence. The recorded information has been reviewed and is accurate.     Russo, Martinique N, PA-C 07/18/16 1655    Orlie Dakin, MD 07/18/16 2107

## 2016-07-18 NOTE — ED Triage Notes (Signed)
Pt. Stated, My back has been in so much pain for the last 3 weeks.

## 2016-07-18 NOTE — ED Notes (Signed)
Back problems and pain x 3 weeks , pain came and went , now since sat it has gotten worse,  Hurts to bend over,  Denies any injury or lifting anything heavy

## 2016-07-19 ENCOUNTER — Other Ambulatory Visit: Payer: Self-pay

## 2016-07-19 ENCOUNTER — Other Ambulatory Visit: Payer: Self-pay | Admitting: *Deleted

## 2016-07-19 ENCOUNTER — Encounter: Payer: Self-pay | Admitting: *Deleted

## 2016-07-19 NOTE — Patient Outreach (Signed)
[facility]) Care Management  [facility] Social Work  07/19/2016  Jadis Mika [facility] 11/04/1965 [id number]  Subjective:  Patient admitted to [facility] with "back pain".   Objective: [facility] CSW to assess patient for services and needs while in the ED and offer [facility] services.   Encounter Medications:  Outpatient Encounter Prescriptions as of 07/19/2016  Medication Sig Note  . amoxicillin-clavulanate (AUGMENTIN) 875-125 MG tablet Take 1 tablet by mouth every 12 (twelve) hours.   . calcium carbonate (TUMS EX) 750 MG chewable tablet Chew 2 tablets by mouth daily as needed for heartburn.   . cyclobenzaprine (FLEXERIL) 10 MG tablet Take 1 tablet (10 mg total) by mouth at bedtime as needed for muscle spasms.   . diazepam (VALIUM) 5 MG tablet Take 1 tablet (5 mg total) by mouth 2 (two) times daily.   . fluticasone (FLONASE) 50 MCG/ACT nasal spray Place 1 spray into both nostrils daily.   Marland Kitchen gabapentin (NEURONTIN) 300 MG capsule Take 600 mg by mouth at bedtime.   Marland Kitchen HYDROmorphone (DILAUDID) 2 MG tablet Take 1 tablet (2 mg total) by mouth every 4 (four) hours as needed for severe pain.   Marland Kitchen HYDROmorphone (DILAUDID) 4 MG tablet Take 1 tablet (4 mg total) by mouth every 4 (four) hours as needed.   . indomethacin (INDOCIN) 25 MG capsule Take 1 capsule (25 mg total) by mouth 3 (three) times daily as needed. (Patient taking differently: Take 25 mg by mouth 3 (three) times daily as needed for mild pain or moderate pain. )   . meloxicam (MOBIC) 7.5 MG tablet Take 1 tablet (7.5 mg total) by mouth daily.   . methylPREDNIsolone (MEDROL DOSPACK) 4 MG tablet follow package directions   . oxyCODONE-acetaminophen (PERCOCET/ROXICET) 5-325 MG per tablet Take 1-2 tablets by mouth every 4 (four) hours as needed for moderate pain. 03/06/2014: 730 am    No facility-administered encounter medications on file as of 07/19/2016.     Functional Status:  No flowsheet data found.  Fall/Depression Screening:  No flowsheet data  found.  Assessment: CSW met with patient in the [facility]  to make initial contact with patient today to perform CSW screening with [facility] patients with acuity level 4 or 5.  CSW introduced self, explained role and types of services provided through [facility] ([place] [facility]).  CSW further explained to patient that CSW wants to ensure that patient has all their needs met, prior to returning home. CSW obtained two HIPAA compliant identifiers from patient, which included patient's name and date of birth.  The following CSW screening was performed: Patient reports he is on Disability (back and learning problems) and receives Disability check and partial Medicaid. He denies any community based services and has plans to see his PCP this month. He admits that the pain was so bad he did not think to call PCP or go to Urgent Care because "I already have a plate in my neck and I wasn't gonna play around with the pain". He also states he does not like to take pain medicines- stating he just deals with the pain.  He drives and also has his fiance who can drive him if needed and denies any trouble with finances as he lives with his fiance and her family.  Patient also reports that he is independent and does not need any assistance. He is planning to seek appointment with the Surgeon who did his back (neckplate) surgery for assessment and treatment of his  back pain.  He denies any thing that would help in prevention of readmission and/or ED visit aside from stabilization of symptoms- "nothing except to get this pain figured out".     CSW provided patient with a [facility] Brochure and further explained the [facility] program. Patient made aware that [facility] services does not interfere or replace home health or other community based services and declines interest or need.   CSW encouraged patient reach out to [facility] if interest or needs arise.  CSW updated the [facility] RN.   Eduard Clos, MSW, [place] Worker  [place] [phone]

## 2016-07-19 NOTE — Patient Outreach (Signed)
Outreach patient after ED visit on 07/18/16.  Patient states he is doing ok.  Asked patent did he reach out to his MD prior to going into the ED and he said no.  He does have information for contacting Doctor after hours.  He was not interested in taking [facility] 24 hour Nurse Advice line number.  Patient was not interested in our services or answering questions to complete engagement tool at this time.

## 2016-08-02 IMAGING — CT CT CERVICAL SPINE W/O CM
3 of 4 series · 12 of 33 positions shown, 14 images · non-contrast
Comparison: 02/28/2013, MRI 11/14/2013

CLINICAL DATA: Six days postop ACDF. Now with difficulty swallowing
and neck swelling.

EXAM:
CT CERVICAL SPINE WITHOUT CONTRAST
TECHNIQUE: Multidetector CT imaging of the cervical spine was performed without
intravenous contrast. Multiplanar CT image reconstructions were also
generated.

[Series 204: sagittal · sagittal · 0.50mm/px · 5 of 68 slices shown, 6 images]
[im 23/68  bone]
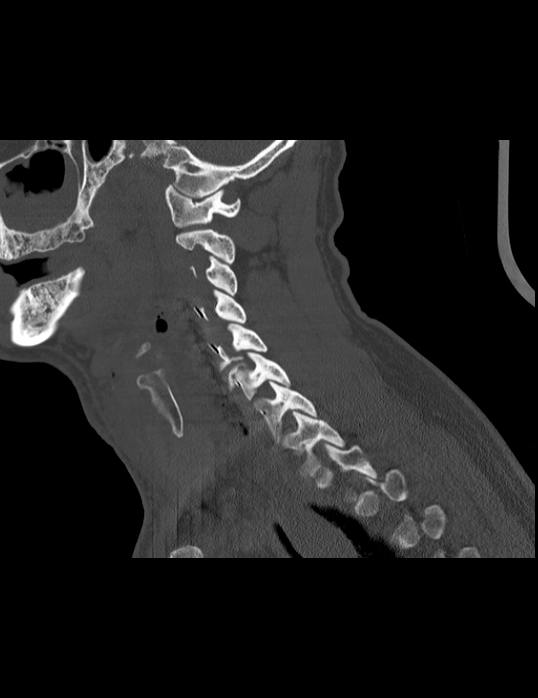
[im 28/68  bone]
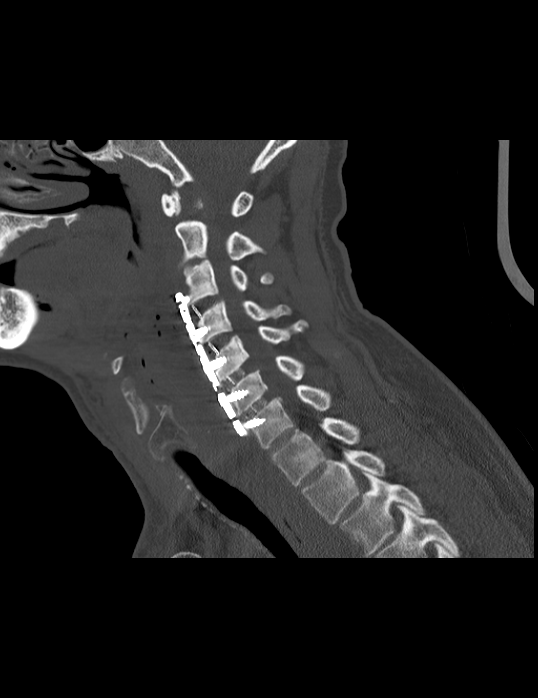
[im 34/68  soft-tissue]
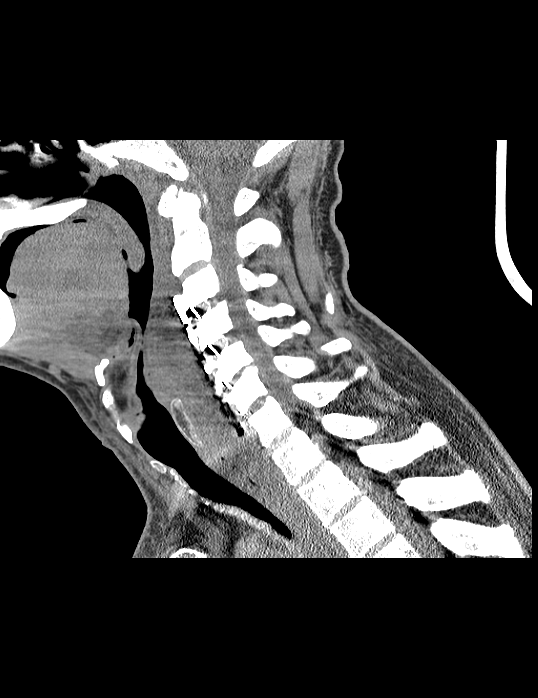
[im 34/68  bone]
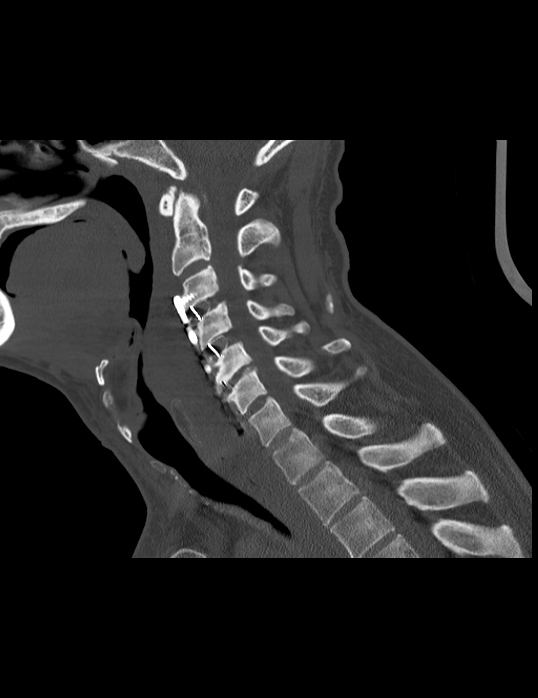
[im 40/68  bone]
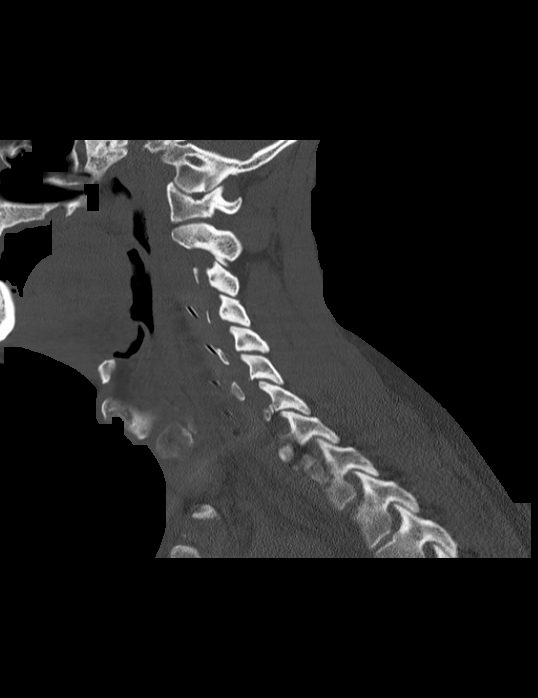
[im 45/68  bone]
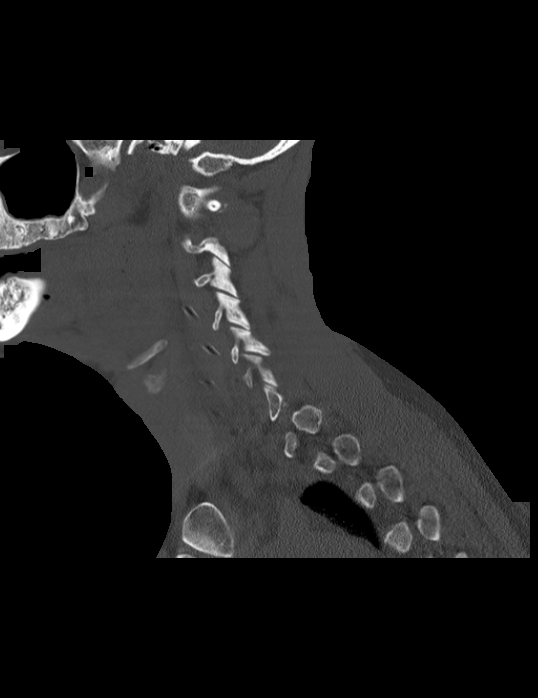

[Series 205: coronal · coronal · 0.51mm/px · 3 of 90 slices shown]
[im 18/90  bone]
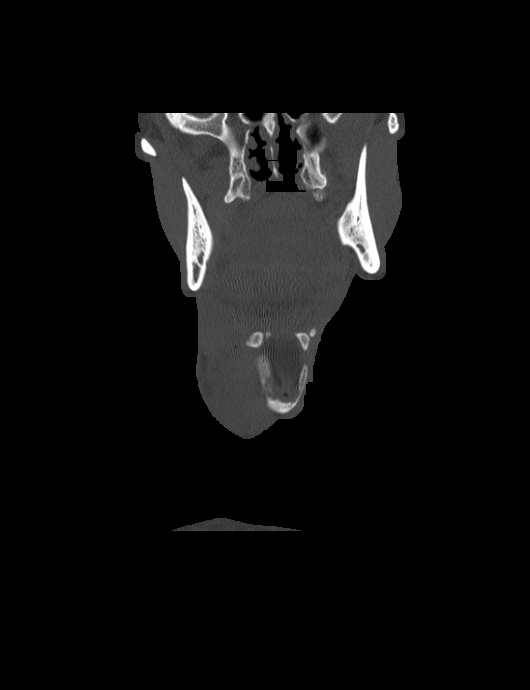
[im 36/90  bone]
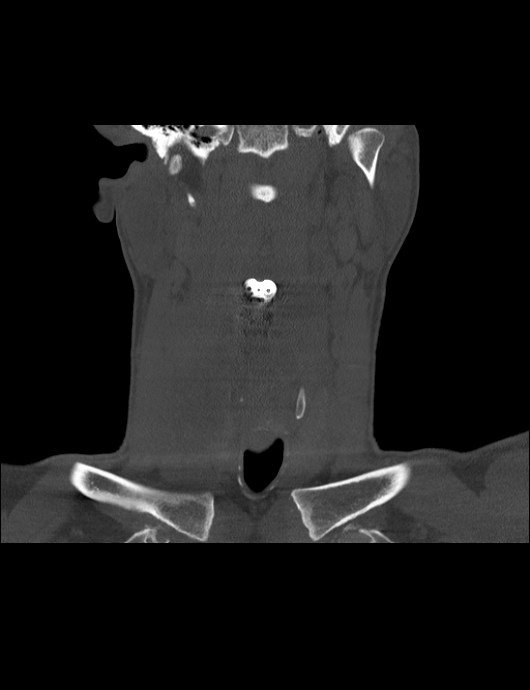
[im 54/90  bone]
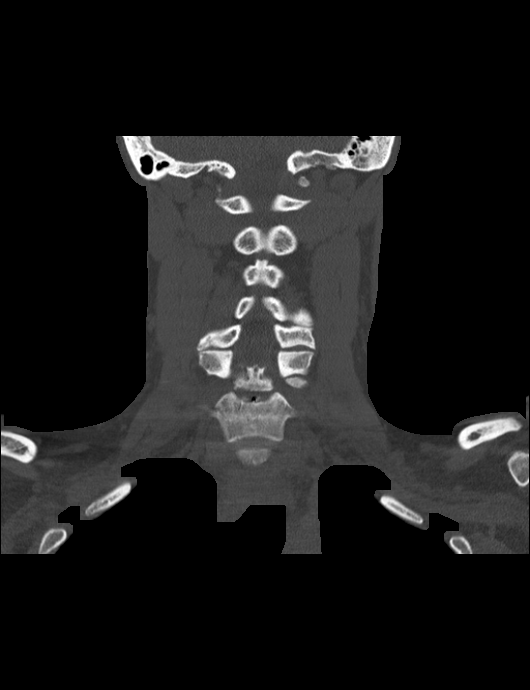

[Series 206: orthogonal · axial · 0.53mm/px · z∈[+47,+181]mm · 4 of 104 slices shown, 5 images]
[im 18/104  soft-tissue]
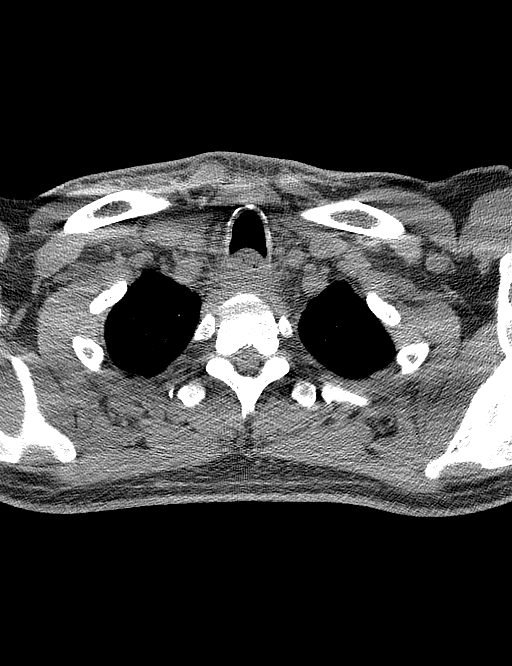
[im 18/104  bone]
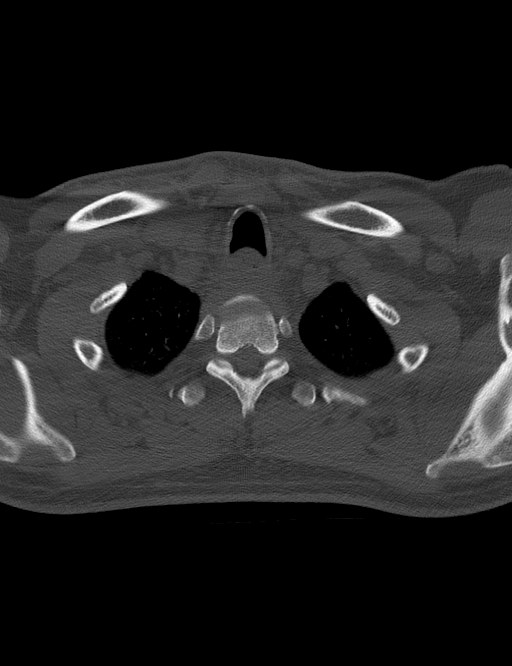
[im 35/104  bone]
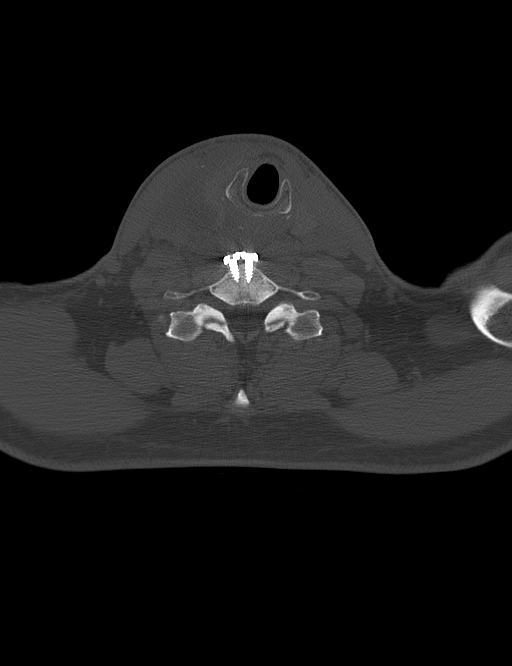
[im 69/104  bone]
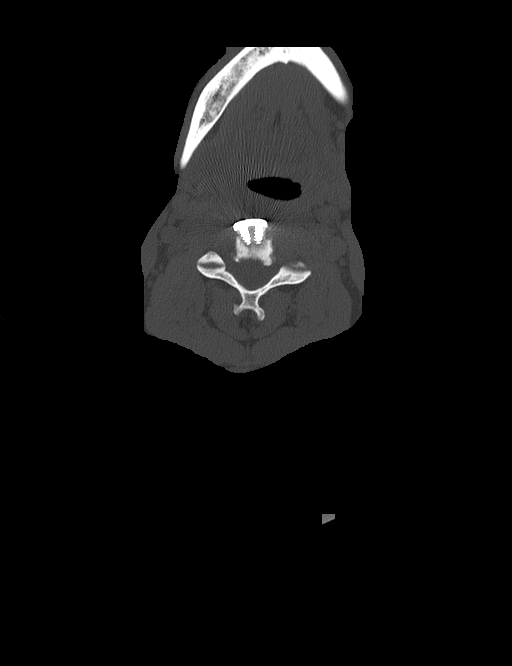
[im 86/104  bone]
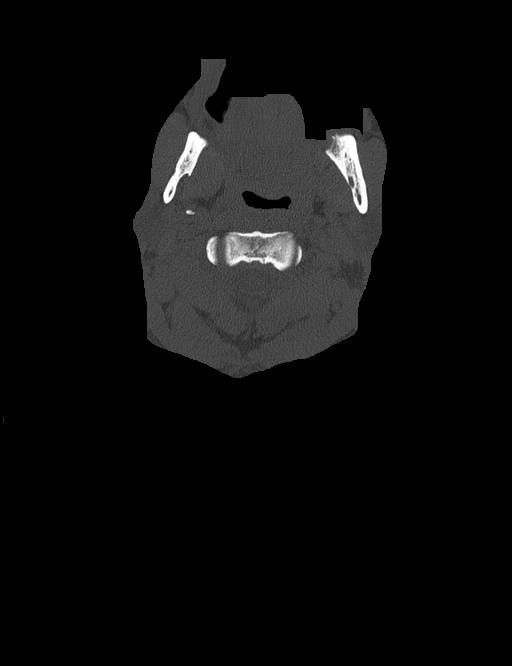

[12 of 33 positions shown; findings below may reference images not displayed]

FINDINGS: ACDF C3 through C7. Anterior plate and screws in good position.
Interbody spacers in good position. No acute bony abnormality. No
fracture or bony erosion.

Extensive soft tissue swelling in the right neck. This is
low-density with scattered gas bubbles and is most consistent with
hematoma along the surgical tract. Prevertebral fluid collection is
present containing gas bubbles. This measures approximately 15 x 35
mm on axial scans. There is a significant amount a gas within the
collection which extends back to the surgical plate. There is
mass-effect on the hypopharynx which is displaced anteriorly and to
the left. There is compression of the proximal esophagus due to
prevertebral edema/ fluid.

Lung apices are clear.
IMPRESSION: Postop ACDF C3 through C7. Plate and hardware in good position. No
acute bony abnormality.

Extensive soft tissue swelling in the right neck and prevertebral
soft tissues consistent with postop hematoma. Retropharyngeal fluid
collection containing large gas bubbles is concerning for abscess.
Hypo pharyngeal or esophageal injury cannot be excluded. Correlate
with fever and white count and symptoms. Fluid collection is
exerting significant mass effect on the hypopharynx and proximal
esophagus.

These results were reviewed in person at the time of interpretation
on 03/06/2014 at [DATE] to Dr. JORGELUIS GUNTHER , who verbally
acknowledged these results.

## 2016-08-30 DIAGNOSIS — Z79899 Other long term (current) drug therapy: Secondary | ICD-10-CM | POA: Diagnosis not present

## 2016-08-30 DIAGNOSIS — E785 Hyperlipidemia, unspecified: Secondary | ICD-10-CM | POA: Diagnosis not present

## 2016-08-30 DIAGNOSIS — G4700 Insomnia, unspecified: Secondary | ICD-10-CM | POA: Diagnosis not present

## 2016-08-30 DIAGNOSIS — M1A0720 Idiopathic chronic gout, left ankle and foot, without tophus (tophi): Secondary | ICD-10-CM | POA: Diagnosis not present

## 2016-08-30 DIAGNOSIS — K219 Gastro-esophageal reflux disease without esophagitis: Secondary | ICD-10-CM | POA: Diagnosis not present

## 2016-09-16 DIAGNOSIS — G4710 Hypersomnia, unspecified: Secondary | ICD-10-CM | POA: Diagnosis not present

## 2016-10-06 DIAGNOSIS — M542 Cervicalgia: Secondary | ICD-10-CM | POA: Diagnosis not present

## 2016-10-06 DIAGNOSIS — M62838 Other muscle spasm: Secondary | ICD-10-CM | POA: Diagnosis not present

## 2016-11-07 DIAGNOSIS — R319 Hematuria, unspecified: Secondary | ICD-10-CM | POA: Diagnosis not present

## 2016-11-07 DIAGNOSIS — Z23 Encounter for immunization: Secondary | ICD-10-CM | POA: Diagnosis not present

## 2016-12-02 DIAGNOSIS — E785 Hyperlipidemia, unspecified: Secondary | ICD-10-CM | POA: Diagnosis not present

## 2016-12-02 DIAGNOSIS — M1A0720 Idiopathic chronic gout, left ankle and foot, without tophus (tophi): Secondary | ICD-10-CM | POA: Diagnosis not present

## 2016-12-02 DIAGNOSIS — Z79899 Other long term (current) drug therapy: Secondary | ICD-10-CM | POA: Diagnosis not present

## 2016-12-02 DIAGNOSIS — K219 Gastro-esophageal reflux disease without esophagitis: Secondary | ICD-10-CM | POA: Diagnosis not present

## 2016-12-09 DIAGNOSIS — G4733 Obstructive sleep apnea (adult) (pediatric): Secondary | ICD-10-CM | POA: Diagnosis not present

## 2017-01-09 DIAGNOSIS — G4733 Obstructive sleep apnea (adult) (pediatric): Secondary | ICD-10-CM | POA: Diagnosis not present

## 2017-01-09 DIAGNOSIS — J301 Allergic rhinitis due to pollen: Secondary | ICD-10-CM | POA: Diagnosis not present

## 2017-01-09 DIAGNOSIS — R5383 Other fatigue: Secondary | ICD-10-CM | POA: Diagnosis not present

## 2017-01-20 DIAGNOSIS — G4733 Obstructive sleep apnea (adult) (pediatric): Secondary | ICD-10-CM | POA: Diagnosis not present

## 2017-01-23 DIAGNOSIS — J301 Allergic rhinitis due to pollen: Secondary | ICD-10-CM | POA: Diagnosis not present

## 2017-01-23 DIAGNOSIS — G4733 Obstructive sleep apnea (adult) (pediatric): Secondary | ICD-10-CM | POA: Diagnosis not present

## 2017-01-23 DIAGNOSIS — R5383 Other fatigue: Secondary | ICD-10-CM | POA: Diagnosis not present

## 2017-02-20 DIAGNOSIS — G4733 Obstructive sleep apnea (adult) (pediatric): Secondary | ICD-10-CM | POA: Diagnosis not present

## 2017-03-20 DIAGNOSIS — G4733 Obstructive sleep apnea (adult) (pediatric): Secondary | ICD-10-CM | POA: Diagnosis not present

## 2017-04-04 DIAGNOSIS — G4733 Obstructive sleep apnea (adult) (pediatric): Secondary | ICD-10-CM | POA: Diagnosis not present

## 2017-06-01 DIAGNOSIS — Z1331 Encounter for screening for depression: Secondary | ICD-10-CM | POA: Diagnosis not present

## 2017-06-01 DIAGNOSIS — R350 Frequency of micturition: Secondary | ICD-10-CM | POA: Diagnosis not present

## 2017-06-01 DIAGNOSIS — M1A0720 Idiopathic chronic gout, left ankle and foot, without tophus (tophi): Secondary | ICD-10-CM | POA: Diagnosis not present

## 2017-06-01 DIAGNOSIS — E785 Hyperlipidemia, unspecified: Secondary | ICD-10-CM | POA: Diagnosis not present

## 2017-06-01 DIAGNOSIS — K219 Gastro-esophageal reflux disease without esophagitis: Secondary | ICD-10-CM | POA: Diagnosis not present

## 2017-06-01 DIAGNOSIS — Z79899 Other long term (current) drug therapy: Secondary | ICD-10-CM | POA: Diagnosis not present

## 2017-10-25 DIAGNOSIS — J069 Acute upper respiratory infection, unspecified: Secondary | ICD-10-CM | POA: Diagnosis not present

## 2017-11-27 DIAGNOSIS — M1A0720 Idiopathic chronic gout, left ankle and foot, without tophus (tophi): Secondary | ICD-10-CM | POA: Diagnosis not present

## 2017-11-27 DIAGNOSIS — Z23 Encounter for immunization: Secondary | ICD-10-CM | POA: Diagnosis not present

## 2017-11-27 DIAGNOSIS — K219 Gastro-esophageal reflux disease without esophagitis: Secondary | ICD-10-CM | POA: Diagnosis not present

## 2017-11-27 DIAGNOSIS — E785 Hyperlipidemia, unspecified: Secondary | ICD-10-CM | POA: Diagnosis not present

## 2017-11-27 DIAGNOSIS — Z79899 Other long term (current) drug therapy: Secondary | ICD-10-CM | POA: Diagnosis not present

## 2017-11-27 DIAGNOSIS — M1990 Unspecified osteoarthritis, unspecified site: Secondary | ICD-10-CM | POA: Diagnosis not present

## 2017-12-08 ENCOUNTER — Other Ambulatory Visit: Payer: Self-pay

## 2017-12-08 NOTE — Patient Outreach (Signed)
[facility]) Care Management  12/08/2017  Timothy Boone [facility] Oct 20, 1965 [id number]   Medication Adherence call to Mr. Timothy Boone patient's telephone number is disconnected patient is due on Atorvastatin 20 mg.Timothy Boone is showing past due under [employer].   [facility] Direct Dial [phone]  Fax [phone] [email or web address]

## 2017-12-20 ENCOUNTER — Other Ambulatory Visit: Payer: Self-pay

## 2017-12-20 NOTE — Patient Outreach (Signed)
[facility]) Care Management  12/20/2017  Timothy Boone [facility] 06-Jul-1965 [id number]   Medication Adherence call to [name] spoke with patient's Girlfriend she  ask if we can call [name] an order Atorvastatin 20 mg. Pharmacy said patient does not have refill and will call doctor for more , left a message at doctor's office to send in a refill request to [place] patient also ask to have doctor send in a refill on his allopurinol 100 mg . [name] is showing past due under [place].   [facility] Direct Dial [phone]  Fax [phone] [name]

## 2017-12-29 DIAGNOSIS — Z23 Encounter for immunization: Secondary | ICD-10-CM | POA: Diagnosis not present

## 2017-12-29 DIAGNOSIS — Z0000 Encounter for general adult medical examination without abnormal findings: Secondary | ICD-10-CM | POA: Diagnosis not present

## 2017-12-29 DIAGNOSIS — E785 Hyperlipidemia, unspecified: Secondary | ICD-10-CM | POA: Diagnosis not present

## 2018-01-17 DIAGNOSIS — G5600 Carpal tunnel syndrome, unspecified upper limb: Secondary | ICD-10-CM | POA: Diagnosis not present

## 2018-02-14 DIAGNOSIS — G5600 Carpal tunnel syndrome, unspecified upper limb: Secondary | ICD-10-CM | POA: Diagnosis not present

## 2018-02-15 DIAGNOSIS — G5600 Carpal tunnel syndrome, unspecified upper limb: Secondary | ICD-10-CM | POA: Diagnosis not present

## 2018-03-12 DIAGNOSIS — Z79899 Other long term (current) drug therapy: Secondary | ICD-10-CM | POA: Diagnosis not present

## 2018-03-12 DIAGNOSIS — M1990 Unspecified osteoarthritis, unspecified site: Secondary | ICD-10-CM | POA: Diagnosis not present

## 2018-03-12 DIAGNOSIS — G5601 Carpal tunnel syndrome, right upper limb: Secondary | ICD-10-CM | POA: Diagnosis not present

## 2018-03-12 DIAGNOSIS — M109 Gout, unspecified: Secondary | ICD-10-CM | POA: Diagnosis not present

## 2018-05-29 DIAGNOSIS — K219 Gastro-esophageal reflux disease without esophagitis: Secondary | ICD-10-CM | POA: Diagnosis not present

## 2018-05-29 DIAGNOSIS — M1A0720 Idiopathic chronic gout, left ankle and foot, without tophus (tophi): Secondary | ICD-10-CM | POA: Diagnosis not present

## 2018-05-29 DIAGNOSIS — G629 Polyneuropathy, unspecified: Secondary | ICD-10-CM | POA: Diagnosis not present

## 2018-05-29 DIAGNOSIS — E785 Hyperlipidemia, unspecified: Secondary | ICD-10-CM | POA: Diagnosis not present

## 2018-05-29 DIAGNOSIS — Z79899 Other long term (current) drug therapy: Secondary | ICD-10-CM | POA: Diagnosis not present

## 2018-05-29 DIAGNOSIS — G5600 Carpal tunnel syndrome, unspecified upper limb: Secondary | ICD-10-CM | POA: Diagnosis not present

## 2018-07-03 DIAGNOSIS — M25569 Pain in unspecified knee: Secondary | ICD-10-CM | POA: Diagnosis not present

## 2018-10-09 DIAGNOSIS — L600 Ingrowing nail: Secondary | ICD-10-CM | POA: Diagnosis not present

## 2018-10-09 DIAGNOSIS — Z23 Encounter for immunization: Secondary | ICD-10-CM | POA: Diagnosis not present

## 2018-10-09 DIAGNOSIS — M1A0720 Idiopathic chronic gout, left ankle and foot, without tophus (tophi): Secondary | ICD-10-CM | POA: Diagnosis not present

## 2018-11-05 ENCOUNTER — Other Ambulatory Visit: Payer: Self-pay

## 2018-11-05 ENCOUNTER — Ambulatory Visit (INDEPENDENT_AMBULATORY_CARE_PROVIDER_SITE_OTHER): Payer: Medicare Other | Admitting: Podiatry

## 2018-11-05 DIAGNOSIS — M79676 Pain in unspecified toe(s): Secondary | ICD-10-CM | POA: Diagnosis not present

## 2018-11-05 DIAGNOSIS — L600 Ingrowing nail: Secondary | ICD-10-CM | POA: Diagnosis not present

## 2018-11-05 NOTE — Progress Notes (Signed)
  Subjective:  Patient ID: Timothy Boone, male    DOB: [birth year],  MRN: [id number]  Chief Complaint  Patient presents with  . Nail Problem    Lt hallux toenail pain -pt states," whole toenail is very painful" x 1 yr ago; 8/10 sharp pains -pt states pain feels like if someone is stepping on it -wrose wtih type of shoes or when toe is wet -pt denies drainge/redness/swellign/warmth Tx: trimmign     53 y.o. male presents with the above complaint. Hx as above.  Review of Systems: Negative except as noted in the HPI. Denies N/V/F/Ch.  Past Medical History:  Diagnosis Date  . Blind right eye   . Chronic back pain   . Degenerative disc disease   . Disc degeneration   . Disc degeneration   . No significant past medical history     Current Outpatient Medications:  .  allopurinol (ZYLOPRIM) 100 MG tablet, Take 100 mg by mouth daily., Disp: , Rfl:  .  atorvastatin (LIPITOR) 20 MG tablet, Take 20 mg by mouth daily., Disp: , Rfl:  .  fluticasone (FLONASE) 50 MCG/ACT nasal spray, Place 1 spray into both nostrils daily., Disp: 9.9 g, Rfl: 2 .  gabapentin (NEURONTIN) 100 MG capsule, Take 100 mg by mouth 3 (three) times daily., Disp: , Rfl:  .  omeprazole (PRILOSEC) 20 MG capsule, Take 20 mg by mouth daily., Disp: , Rfl:   Social History   Tobacco Use  Smoking Status Current Every Day Smoker  . Packs/day: 0.50  . Years: 36.00  . Pack years: 18.00  . Types: Cigarettes  Smokeless Tobacco Former User    Allergies  Allergen Reactions  . Chicken Allergy Itching and Other (See Comments)    Itching and burning on hands and feet.  . Demerol [Meperidine]     Hallucinations   . Penicillins Itching  . Tramadol Other (See Comments)    Hallucinations    Objective:  There were no vitals filed for this visit. There is no height or weight on file to calculate BMI. Constitutional Well developed. Well nourished.  Vascular Dorsalis pedis pulses palpable bilaterally. Posterior tibial pulses  palpable bilaterally. Capillary refill normal to all digits.  No cyanosis or clubbing noted. Pedal hair growth normal.  Neurologic Normal speech. Oriented to person, place, and time. Epicritic sensation to light touch grossly present bilaterally.  Dermatologic Painful ingrowing nail at medial nail borders of the hallux nail bilaterally. Thickened border No other open wounds. No skin lesions.  Orthopedic: Normal joint ROM without pain or crepitus bilaterally. No visible deformities. No bony tenderness.   Radiographs: None Assessment:   1. Ingrown nail   2. Pain around toenail    Plan:  Patient was evaluated and treated and all questions answered.  Ingrown Nail, bilaterally -Nails debrided in slant back fashion to patient relief. -Would consider permanent partial removal should pain persist.  Return if symptoms worsen or fail to improve.

## 2018-11-05 NOTE — Patient Instructions (Signed)

## 2018-11-26 DIAGNOSIS — Z87891 Personal history of nicotine dependence: Secondary | ICD-10-CM | POA: Diagnosis not present

## 2018-11-26 DIAGNOSIS — M1A0720 Idiopathic chronic gout, left ankle and foot, without tophus (tophi): Secondary | ICD-10-CM | POA: Diagnosis not present

## 2018-11-26 DIAGNOSIS — Z79899 Other long term (current) drug therapy: Secondary | ICD-10-CM | POA: Diagnosis not present

## 2018-11-26 DIAGNOSIS — E785 Hyperlipidemia, unspecified: Secondary | ICD-10-CM | POA: Diagnosis not present

## 2018-11-26 DIAGNOSIS — K219 Gastro-esophageal reflux disease without esophagitis: Secondary | ICD-10-CM | POA: Diagnosis not present

## 2018-12-04 DIAGNOSIS — M25452 Effusion, left hip: Secondary | ICD-10-CM | POA: Diagnosis not present

## 2018-12-04 DIAGNOSIS — M11252 Other chondrocalcinosis, left hip: Secondary | ICD-10-CM | POA: Diagnosis not present

## 2018-12-04 DIAGNOSIS — M25451 Effusion, right hip: Secondary | ICD-10-CM | POA: Diagnosis not present

## 2018-12-04 DIAGNOSIS — M25552 Pain in left hip: Secondary | ICD-10-CM | POA: Diagnosis not present

## 2019-01-01 DIAGNOSIS — Z0000 Encounter for general adult medical examination without abnormal findings: Secondary | ICD-10-CM | POA: Diagnosis not present

## 2019-01-01 DIAGNOSIS — Z9181 History of falling: Secondary | ICD-10-CM | POA: Diagnosis not present

## 2019-01-01 DIAGNOSIS — E785 Hyperlipidemia, unspecified: Secondary | ICD-10-CM | POA: Diagnosis not present

## 2019-04-23 DIAGNOSIS — R0789 Other chest pain: Secondary | ICD-10-CM | POA: Diagnosis not present

## 2019-04-23 DIAGNOSIS — M25512 Pain in left shoulder: Secondary | ICD-10-CM | POA: Diagnosis not present

## 2019-04-23 DIAGNOSIS — R079 Chest pain, unspecified: Secondary | ICD-10-CM | POA: Diagnosis not present

## 2019-04-24 DIAGNOSIS — M25512 Pain in left shoulder: Secondary | ICD-10-CM | POA: Diagnosis not present

## 2019-05-09 DIAGNOSIS — M25512 Pain in left shoulder: Secondary | ICD-10-CM | POA: Diagnosis not present

## 2019-05-27 DIAGNOSIS — E785 Hyperlipidemia, unspecified: Secondary | ICD-10-CM | POA: Diagnosis not present

## 2019-05-27 DIAGNOSIS — M1A0720 Idiopathic chronic gout, left ankle and foot, without tophus (tophi): Secondary | ICD-10-CM | POA: Diagnosis not present

## 2019-05-27 DIAGNOSIS — K219 Gastro-esophageal reflux disease without esophagitis: Secondary | ICD-10-CM | POA: Diagnosis not present

## 2019-05-27 DIAGNOSIS — Z79899 Other long term (current) drug therapy: Secondary | ICD-10-CM | POA: Diagnosis not present

## 2019-05-27 DIAGNOSIS — M25512 Pain in left shoulder: Secondary | ICD-10-CM | POA: Diagnosis not present

## 2019-07-02 DIAGNOSIS — M25512 Pain in left shoulder: Secondary | ICD-10-CM | POA: Diagnosis not present

## 2019-07-02 DIAGNOSIS — M19012 Primary osteoarthritis, left shoulder: Secondary | ICD-10-CM | POA: Diagnosis not present

## 2019-07-24 DIAGNOSIS — M7502 Adhesive capsulitis of left shoulder: Secondary | ICD-10-CM | POA: Diagnosis not present

## 2019-07-24 DIAGNOSIS — M25512 Pain in left shoulder: Secondary | ICD-10-CM | POA: Diagnosis not present

## 2019-08-06 DIAGNOSIS — S0500XA Injury of conjunctiva and corneal abrasion without foreign body, unspecified eye, initial encounter: Secondary | ICD-10-CM | POA: Diagnosis not present

## 2019-08-20 ENCOUNTER — Emergency Department (HOSPITAL_COMMUNITY)
Admission: EM | Admit: 2019-08-20 | Discharge: 2019-08-20 | Disposition: A | Discharge status: Home / Self Care | Payer: Medicare Other | Attending: Emergency Medicine | Admitting: Emergency Medicine

## 2019-08-20 ENCOUNTER — Encounter (HOSPITAL_COMMUNITY): Payer: Self-pay | Admitting: *Deleted

## 2019-08-20 ENCOUNTER — Other Ambulatory Visit: Payer: Self-pay

## 2019-08-20 DIAGNOSIS — H579 Unspecified disorder of eye and adnexa: Secondary | ICD-10-CM | POA: Diagnosis not present

## 2019-08-20 DIAGNOSIS — Z5321 Procedure and treatment not carried out due to patient leaving prior to being seen by health care provider: Secondary | ICD-10-CM | POA: Diagnosis not present

## 2019-08-20 DIAGNOSIS — H538 Other visual disturbances: Secondary | ICD-10-CM | POA: Insufficient documentation

## 2019-08-20 NOTE — ED Triage Notes (Signed)
Pt is here with left eye problem, feels like something is in there.  This has been going on for one month. Pt states at times his vision blurs.  Pupils 3 round

## 2019-08-26 DIAGNOSIS — J019 Acute sinusitis, unspecified: Secondary | ICD-10-CM | POA: Diagnosis not present

## 2019-08-28 DIAGNOSIS — J3489 Other specified disorders of nose and nasal sinuses: Secondary | ICD-10-CM | POA: Diagnosis not present

## 2019-08-28 DIAGNOSIS — J029 Acute pharyngitis, unspecified: Secondary | ICD-10-CM | POA: Diagnosis not present

## 2019-08-28 DIAGNOSIS — R05 Cough: Secondary | ICD-10-CM | POA: Diagnosis not present

## 2019-08-30 ENCOUNTER — Encounter: Payer: Self-pay | Admitting: Physician Assistant

## 2019-08-30 ENCOUNTER — Other Ambulatory Visit: Payer: Self-pay | Admitting: Physician Assistant

## 2019-08-30 DIAGNOSIS — U071 COVID-19: Secondary | ICD-10-CM

## 2019-08-30 DIAGNOSIS — Z6830 Body mass index (BMI) 30.0-30.9, adult: Secondary | ICD-10-CM | POA: Insufficient documentation

## 2019-08-30 NOTE — Progress Notes (Signed)
I connected by phone with Timothy Boone on 08/30/2019 at 2:01 PM to discuss the potential use of a new treatment for mild to moderate COVID-19 viral infection in non-hospitalized patients.  This patient is a 54 y.o. male that meets the FDA criteria for Emergency Use Authorization of COVID monoclonal antibody casirivimab/imdevimab.  Has a (+) direct SARS-CoV-2 viral test result  Has mild or moderate COVID-19   Is NOT hospitalized due to COVID-19  Is within 10 days of symptom onset  Has at least one of the high risk factor(s) for progression to severe COVID-19 and/or hospitalization as defined in EUA.  Specific high risk criteria : BMI > 25   I have spoken and communicated the following to the patient or parent/caregiver regarding COVID monoclonal antibody treatment:  1. FDA has authorized the emergency use for the treatment of mild to moderate COVID-19 in adults and pediatric patients with positive results of direct SARS-CoV-2 viral testing who are 31 years of age and older weighing at least 40 kg, and who are at high risk for progressing to severe COVID-19 and/or hospitalization.  2. The significant known and potential risks and benefits of COVID monoclonal antibody, and the extent to which such potential risks and benefits are unknown.  3. Information on available alternative treatments and the risks and benefits of those alternatives, including clinical trials.  4. Patients treated with COVID monoclonal antibody should continue to self-isolate and use infection control measures (e.g., wear mask, isolate, social distance, avoid sharing personal items, clean and disinfect "high touch" surfaces, and frequent handwashing) according to CDC guidelines.   5. The patient or parent/caregiver has the option to accept or refuse COVID monoclonal antibody treatment.  After reviewing this information with the patient, The patient agreed to proceed with receiving casirivimab\imdevimab infusion and  will be provided a copy of the Fact sheet prior to receiving the infusion.  Sx onset 8/18. Set up for infusion on 8/28 @ 5pm. Directions given to [facility]. Pt is aware that insurance will be charged an infusion fee. He is not vaccinated.   [place] Form 08/30/2019 2:01 PM

## 2019-08-31 ENCOUNTER — Ambulatory Visit (HOSPITAL_COMMUNITY)
Admission: RE | Admit: 2019-08-31 | Discharge: 2019-08-31 | Disposition: A | Discharge status: Home / Self Care | Payer: Medicare Other | Source: Ambulatory Visit | Attending: Pulmonary Disease | Admitting: Pulmonary Disease

## 2019-08-31 DIAGNOSIS — U071 COVID-19: Secondary | ICD-10-CM | POA: Diagnosis present

## 2019-08-31 DIAGNOSIS — Z23 Encounter for immunization: Secondary | ICD-10-CM | POA: Diagnosis not present

## 2019-08-31 DIAGNOSIS — Z6830 Body mass index (BMI) 30.0-30.9, adult: Secondary | ICD-10-CM | POA: Diagnosis present

## 2019-08-31 MED ORDER — SODIUM CHLORIDE 0.9 % IV SOLN: INTRAVENOUS | Status: DC | PRN

## 2019-08-31 MED ORDER — ALBUTEROL SULFATE HFA 108 (90 BASE) MCG/ACT IN AERS: 2.0000 | INHALATION_SPRAY | Freq: Once | RESPIRATORY_TRACT | Status: DC | PRN

## 2019-08-31 MED ORDER — METHYLPREDNISOLONE SODIUM SUCC 125 MG IJ SOLR: 125.0000 mg | Freq: Once | INTRAMUSCULAR | Status: DC | PRN

## 2019-08-31 MED ORDER — SODIUM CHLORIDE 0.9 % IV SOLN
1200.0000 mg | Freq: Once | INTRAVENOUS | Status: AC
  Administered 2019-08-31: 1200 mg via INTRAVENOUS

## 2019-08-31 MED ORDER — FAMOTIDINE IN NACL 20-0.9 MG/50ML-% IV SOLN: 20.0000 mg | Freq: Once | INTRAVENOUS | Status: DC | PRN

## 2019-08-31 MED ORDER — EPINEPHRINE 0.3 MG/0.3ML IJ SOAJ: 0.3000 mg | Freq: Once | INTRAMUSCULAR | Status: DC | PRN

## 2019-08-31 MED ORDER — DIPHENHYDRAMINE HCL 50 MG/ML IJ SOLN: 50.0000 mg | Freq: Once | INTRAMUSCULAR | Status: DC | PRN

## 2019-08-31 NOTE — Discharge Instructions (Signed)

## 2019-08-31 NOTE — Progress Notes (Signed)
  Diagnosis: COVID-19  Physician: Patrick Wright, MD  Procedure: Cov[facility] Infusion Clinic Med: casirivimab\imdevimab infusion - Provided patient with casirivimab\imdevimab fact sheet for patients, parents and caregivers prior to infusion.  Complications: No immediate complications noted.  Discharge: Discharged home   Timothy Boone N Timothy Boone 08/31/2019  

## 2019-09-02 DIAGNOSIS — J189 Pneumonia, unspecified organism: Secondary | ICD-10-CM | POA: Diagnosis not present

## 2019-09-02 DIAGNOSIS — U071 COVID-19: Secondary | ICD-10-CM | POA: Diagnosis not present

## 2020-01-09 DIAGNOSIS — E785 Hyperlipidemia, unspecified: Secondary | ICD-10-CM | POA: Diagnosis not present

## 2020-01-09 DIAGNOSIS — K219 Gastro-esophageal reflux disease without esophagitis: Secondary | ICD-10-CM | POA: Diagnosis not present

## 2020-01-09 DIAGNOSIS — Z79899 Other long term (current) drug therapy: Secondary | ICD-10-CM | POA: Diagnosis not present

## 2020-01-09 DIAGNOSIS — M1A0720 Idiopathic chronic gout, left ankle and foot, without tophus (tophi): Secondary | ICD-10-CM | POA: Diagnosis not present

## 2020-01-09 DIAGNOSIS — M25551 Pain in right hip: Secondary | ICD-10-CM | POA: Diagnosis not present

## 2020-01-23 DIAGNOSIS — M1610 Unilateral primary osteoarthritis, unspecified hip: Secondary | ICD-10-CM | POA: Diagnosis not present

## 2020-01-29 DIAGNOSIS — M1611 Unilateral primary osteoarthritis, right hip: Secondary | ICD-10-CM | POA: Diagnosis not present

## 2020-02-04 DIAGNOSIS — Z79899 Other long term (current) drug therapy: Secondary | ICD-10-CM | POA: Diagnosis not present

## 2020-02-04 DIAGNOSIS — E7800 Pure hypercholesterolemia, unspecified: Secondary | ICD-10-CM | POA: Diagnosis not present

## 2020-02-04 DIAGNOSIS — F17210 Nicotine dependence, cigarettes, uncomplicated: Secondary | ICD-10-CM | POA: Diagnosis not present

## 2020-02-04 DIAGNOSIS — M25552 Pain in left hip: Secondary | ICD-10-CM | POA: Diagnosis not present

## 2020-02-04 DIAGNOSIS — I10 Essential (primary) hypertension: Secondary | ICD-10-CM | POA: Diagnosis not present

## 2020-02-20 DIAGNOSIS — M1610 Unilateral primary osteoarthritis, unspecified hip: Secondary | ICD-10-CM | POA: Diagnosis not present

## 2020-07-13 DIAGNOSIS — Z23 Encounter for immunization: Secondary | ICD-10-CM | POA: Diagnosis not present

## 2020-07-13 DIAGNOSIS — K219 Gastro-esophageal reflux disease without esophagitis: Secondary | ICD-10-CM | POA: Diagnosis not present

## 2020-07-13 DIAGNOSIS — M1A0720 Idiopathic chronic gout, left ankle and foot, without tophus (tophi): Secondary | ICD-10-CM | POA: Diagnosis not present

## 2020-07-13 DIAGNOSIS — E785 Hyperlipidemia, unspecified: Secondary | ICD-10-CM | POA: Diagnosis not present

## 2020-07-13 DIAGNOSIS — K635 Polyp of colon: Secondary | ICD-10-CM | POA: Diagnosis not present

## 2020-07-13 DIAGNOSIS — Z79899 Other long term (current) drug therapy: Secondary | ICD-10-CM | POA: Diagnosis not present

## 2020-09-23 DIAGNOSIS — Z9181 History of falling: Secondary | ICD-10-CM | POA: Diagnosis not present

## 2020-09-23 DIAGNOSIS — E785 Hyperlipidemia, unspecified: Secondary | ICD-10-CM | POA: Diagnosis not present

## 2020-09-23 DIAGNOSIS — Z0000 Encounter for general adult medical examination without abnormal findings: Secondary | ICD-10-CM | POA: Diagnosis not present

## 2020-10-13 DIAGNOSIS — Z23 Encounter for immunization: Secondary | ICD-10-CM | POA: Diagnosis not present

## 2020-10-13 DIAGNOSIS — S81819A Laceration without foreign body, unspecified lower leg, initial encounter: Secondary | ICD-10-CM | POA: Diagnosis not present

## 2020-10-13 DIAGNOSIS — M5090 Cervical disc disorder, unspecified, unspecified cervical region: Secondary | ICD-10-CM | POA: Diagnosis not present

## 2020-11-18 DIAGNOSIS — M109 Gout, unspecified: Secondary | ICD-10-CM | POA: Diagnosis not present

## 2020-11-18 DIAGNOSIS — M25551 Pain in right hip: Secondary | ICD-10-CM | POA: Diagnosis not present

## 2020-11-18 DIAGNOSIS — M10051 Idiopathic gout, right hip: Secondary | ICD-10-CM | POA: Diagnosis not present

## 2020-11-21 DIAGNOSIS — Z743 Need for continuous supervision: Secondary | ICD-10-CM | POA: Diagnosis not present

## 2020-11-21 DIAGNOSIS — M25572 Pain in left ankle and joints of left foot: Secondary | ICD-10-CM | POA: Diagnosis not present

## 2020-11-21 DIAGNOSIS — M25551 Pain in right hip: Secondary | ICD-10-CM | POA: Diagnosis not present

## 2020-11-21 DIAGNOSIS — R5381 Other malaise: Secondary | ICD-10-CM | POA: Diagnosis not present

## 2020-11-24 DIAGNOSIS — M25572 Pain in left ankle and joints of left foot: Secondary | ICD-10-CM | POA: Diagnosis not present

## 2020-11-24 DIAGNOSIS — R6889 Other general symptoms and signs: Secondary | ICD-10-CM | POA: Diagnosis not present

## 2020-11-24 DIAGNOSIS — M25551 Pain in right hip: Secondary | ICD-10-CM | POA: Diagnosis not present

## 2020-11-24 DIAGNOSIS — Z743 Need for continuous supervision: Secondary | ICD-10-CM | POA: Diagnosis not present

## 2020-12-08 DIAGNOSIS — M1711 Unilateral primary osteoarthritis, right knee: Secondary | ICD-10-CM | POA: Diagnosis not present

## 2020-12-08 DIAGNOSIS — M1610 Unilateral primary osteoarthritis, unspecified hip: Secondary | ICD-10-CM | POA: Diagnosis not present

## 2020-12-11 DIAGNOSIS — M1611 Unilateral primary osteoarthritis, right hip: Secondary | ICD-10-CM | POA: Diagnosis not present

## 2020-12-11 DIAGNOSIS — M1610 Unilateral primary osteoarthritis, unspecified hip: Secondary | ICD-10-CM | POA: Diagnosis not present

## 2021-01-13 DIAGNOSIS — Z79899 Other long term (current) drug therapy: Secondary | ICD-10-CM | POA: Diagnosis not present

## 2021-01-13 DIAGNOSIS — K219 Gastro-esophageal reflux disease without esophagitis: Secondary | ICD-10-CM | POA: Diagnosis not present

## 2021-01-13 DIAGNOSIS — M1990 Unspecified osteoarthritis, unspecified site: Secondary | ICD-10-CM | POA: Diagnosis not present

## 2021-01-13 DIAGNOSIS — Z23 Encounter for immunization: Secondary | ICD-10-CM | POA: Diagnosis not present

## 2021-01-13 DIAGNOSIS — E785 Hyperlipidemia, unspecified: Secondary | ICD-10-CM | POA: Diagnosis not present

## 2021-01-13 DIAGNOSIS — M1A0720 Idiopathic chronic gout, left ankle and foot, without tophus (tophi): Secondary | ICD-10-CM | POA: Diagnosis not present

## 2021-03-31 DIAGNOSIS — M1611 Unilateral primary osteoarthritis, right hip: Secondary | ICD-10-CM | POA: Diagnosis not present

## 2021-04-28 DIAGNOSIS — S39012A Strain of muscle, fascia and tendon of lower back, initial encounter: Secondary | ICD-10-CM | POA: Diagnosis not present

## 2021-04-28 DIAGNOSIS — S76012A Strain of muscle, fascia and tendon of left hip, initial encounter: Secondary | ICD-10-CM | POA: Diagnosis not present

## 2021-04-28 DIAGNOSIS — M25552 Pain in left hip: Secondary | ICD-10-CM | POA: Diagnosis not present

## 2021-07-02 DIAGNOSIS — J40 Bronchitis, not specified as acute or chronic: Secondary | ICD-10-CM | POA: Diagnosis not present

## 2021-07-07 DIAGNOSIS — K219 Gastro-esophageal reflux disease without esophagitis: Secondary | ICD-10-CM | POA: Diagnosis not present

## 2021-07-07 DIAGNOSIS — Z1211 Encounter for screening for malignant neoplasm of colon: Secondary | ICD-10-CM | POA: Diagnosis not present

## 2021-07-07 DIAGNOSIS — Z87891 Personal history of nicotine dependence: Secondary | ICD-10-CM | POA: Diagnosis not present

## 2021-07-07 DIAGNOSIS — M1990 Unspecified osteoarthritis, unspecified site: Secondary | ICD-10-CM | POA: Diagnosis not present

## 2021-07-07 DIAGNOSIS — Z79899 Other long term (current) drug therapy: Secondary | ICD-10-CM | POA: Diagnosis not present

## 2021-07-07 DIAGNOSIS — M1A0720 Idiopathic chronic gout, left ankle and foot, without tophus (tophi): Secondary | ICD-10-CM | POA: Diagnosis not present

## 2021-07-07 DIAGNOSIS — E785 Hyperlipidemia, unspecified: Secondary | ICD-10-CM | POA: Diagnosis not present

## 2021-07-13 DIAGNOSIS — Z87891 Personal history of nicotine dependence: Secondary | ICD-10-CM | POA: Diagnosis not present

## 2021-07-13 DIAGNOSIS — Z122 Encounter for screening for malignant neoplasm of respiratory organs: Secondary | ICD-10-CM | POA: Diagnosis not present

## 2021-07-13 DIAGNOSIS — K8020 Calculus of gallbladder without cholecystitis without obstruction: Secondary | ICD-10-CM | POA: Diagnosis not present

## 2021-07-13 DIAGNOSIS — I2510 Atherosclerotic heart disease of native coronary artery without angina pectoris: Secondary | ICD-10-CM | POA: Diagnosis not present

## 2021-07-13 DIAGNOSIS — F17210 Nicotine dependence, cigarettes, uncomplicated: Secondary | ICD-10-CM | POA: Diagnosis not present

## 2021-08-16 DIAGNOSIS — K8020 Calculus of gallbladder without cholecystitis without obstruction: Secondary | ICD-10-CM | POA: Diagnosis not present

## 2021-08-16 DIAGNOSIS — K219 Gastro-esophageal reflux disease without esophagitis: Secondary | ICD-10-CM | POA: Diagnosis not present

## 2021-08-16 DIAGNOSIS — R1310 Dysphagia, unspecified: Secondary | ICD-10-CM | POA: Diagnosis not present

## 2021-09-21 DIAGNOSIS — K222 Esophageal obstruction: Secondary | ICD-10-CM | POA: Diagnosis not present

## 2021-09-21 DIAGNOSIS — Z86010 Personal history of colonic polyps: Secondary | ICD-10-CM | POA: Diagnosis not present

## 2021-09-21 DIAGNOSIS — Z880 Allergy status to penicillin: Secondary | ICD-10-CM | POA: Diagnosis not present

## 2021-09-21 DIAGNOSIS — K5730 Diverticulosis of large intestine without perforation or abscess without bleeding: Secondary | ICD-10-CM | POA: Diagnosis not present

## 2021-09-21 DIAGNOSIS — R1310 Dysphagia, unspecified: Secondary | ICD-10-CM | POA: Diagnosis not present

## 2021-09-21 DIAGNOSIS — K449 Diaphragmatic hernia without obstruction or gangrene: Secondary | ICD-10-CM | POA: Diagnosis not present

## 2021-09-21 DIAGNOSIS — M109 Gout, unspecified: Secondary | ICD-10-CM | POA: Diagnosis not present

## 2021-09-21 DIAGNOSIS — K219 Gastro-esophageal reflux disease without esophagitis: Secondary | ICD-10-CM | POA: Diagnosis not present

## 2021-09-21 DIAGNOSIS — Z79899 Other long term (current) drug therapy: Secondary | ICD-10-CM | POA: Diagnosis not present

## 2021-09-21 DIAGNOSIS — K2970 Gastritis, unspecified, without bleeding: Secondary | ICD-10-CM | POA: Diagnosis not present

## 2021-09-21 DIAGNOSIS — F17210 Nicotine dependence, cigarettes, uncomplicated: Secondary | ICD-10-CM | POA: Diagnosis not present

## 2021-10-14 DIAGNOSIS — M545 Low back pain, unspecified: Secondary | ICD-10-CM | POA: Diagnosis not present

## 2022-01-05 DIAGNOSIS — Z79899 Other long term (current) drug therapy: Secondary | ICD-10-CM | POA: Diagnosis not present

## 2022-01-05 DIAGNOSIS — Z23 Encounter for immunization: Secondary | ICD-10-CM | POA: Diagnosis not present

## 2022-01-05 DIAGNOSIS — K219 Gastro-esophageal reflux disease without esophagitis: Secondary | ICD-10-CM | POA: Diagnosis not present

## 2022-01-05 DIAGNOSIS — R350 Frequency of micturition: Secondary | ICD-10-CM | POA: Diagnosis not present

## 2022-01-05 DIAGNOSIS — M1A0720 Idiopathic chronic gout, left ankle and foot, without tophus (tophi): Secondary | ICD-10-CM | POA: Diagnosis not present

## 2022-01-05 DIAGNOSIS — E785 Hyperlipidemia, unspecified: Secondary | ICD-10-CM | POA: Diagnosis not present

## 2022-07-12 DIAGNOSIS — G4700 Insomnia, unspecified: Secondary | ICD-10-CM | POA: Diagnosis not present

## 2022-07-12 DIAGNOSIS — R350 Frequency of micturition: Secondary | ICD-10-CM | POA: Diagnosis not present

## 2022-07-12 DIAGNOSIS — M1A0720 Idiopathic chronic gout, left ankle and foot, without tophus (tophi): Secondary | ICD-10-CM | POA: Diagnosis not present

## 2022-07-12 DIAGNOSIS — Z79899 Other long term (current) drug therapy: Secondary | ICD-10-CM | POA: Diagnosis not present

## 2022-07-12 DIAGNOSIS — E785 Hyperlipidemia, unspecified: Secondary | ICD-10-CM | POA: Diagnosis not present

## 2022-07-12 DIAGNOSIS — K219 Gastro-esophageal reflux disease without esophagitis: Secondary | ICD-10-CM | POA: Diagnosis not present

## 2022-07-31 DIAGNOSIS — M1611 Unilateral primary osteoarthritis, right hip: Secondary | ICD-10-CM | POA: Diagnosis not present

## 2022-07-31 DIAGNOSIS — M25551 Pain in right hip: Secondary | ICD-10-CM | POA: Diagnosis not present

## 2022-07-31 DIAGNOSIS — M8580 Other specified disorders of bone density and structure, unspecified site: Secondary | ICD-10-CM | POA: Diagnosis not present

## 2022-08-04 ENCOUNTER — Telehealth: Payer: Self-pay | Admitting: *Deleted

## 2022-08-04 NOTE — Telephone Encounter (Signed)
Transition Care Management Unsuccessful Follow-up Telephone Call  Date of discharge and from where:  [facility] 07/31/2022  Attempts:  1st Attempt  Reason for unsuccessful TCM follow-up call Not available

## 2022-08-05 ENCOUNTER — Telehealth: Payer: Self-pay | Admitting: *Deleted

## 2022-08-05 NOTE — Telephone Encounter (Signed)
Transition Care Management Unsuccessful Follow-up Telephone Call  Date of discharge and from where:  [facility] 07/31/2022  Attempts:  2nd Attempt  Reason for unsuccessful TCM follow-up call:  No answer/busy

## 2022-11-21 ENCOUNTER — Emergency Department
Admission: EM | Admit: 2022-11-21 | Discharge: 2022-11-21 | Disposition: A | Discharge status: Home / Self Care | Payer: Medicare Other | Attending: Emergency Medicine | Admitting: Emergency Medicine

## 2022-11-21 ENCOUNTER — Other Ambulatory Visit: Payer: Self-pay

## 2022-11-21 DIAGNOSIS — Y9389 Activity, other specified: Secondary | ICD-10-CM | POA: Insufficient documentation

## 2022-11-21 DIAGNOSIS — M7051 Other bursitis of knee, right knee: Secondary | ICD-10-CM | POA: Insufficient documentation

## 2022-11-21 DIAGNOSIS — M25561 Pain in right knee: Secondary | ICD-10-CM | POA: Diagnosis present

## 2022-11-21 DIAGNOSIS — M7050 Other bursitis of knee, unspecified knee: Secondary | ICD-10-CM

## 2022-11-21 MED ORDER — KETOROLAC TROMETHAMINE 15 MG/ML IJ SOLN
15.0000 mg | Freq: Once | INTRAMUSCULAR | Status: AC
  Administered 2022-11-21: 15 mg via INTRAMUSCULAR
  Filled 2022-11-21: qty 1

## 2022-11-21 MED ORDER — DEXAMETHASONE SODIUM PHOSPHATE 10 MG/ML IJ SOLN
10.0000 mg | Freq: Once | INTRAMUSCULAR | Status: AC
  Administered 2022-11-21: 10 mg via INTRAMUSCULAR
  Filled 2022-11-21: qty 1

## 2022-11-21 MED ORDER — LIDOCAINE 4 % EX PTCH: 1.0000 | MEDICATED_PATCH | CUTANEOUS | 1 refills | Status: DC

## 2022-11-21 MED ORDER — MELOXICAM 15 MG PO TABS: 15.0000 mg | ORAL_TABLET | Freq: Every day | ORAL | 0 refills | Status: AC

## 2022-11-21 NOTE — ED Provider Notes (Signed)
[facility] Provider Note    Event Date/Time   First MD Initiated Contact with Patient 11/21/22 1839     (approximate)   History   Knee Pain   HPI  COLTER PAUMEN is a 57 y.o. male with PMH of DDD, chronic back pain and gout presents for evaluation of right knee pain.  Patient states he tried to treat the knee pain with his gout medication, colchicine and did not get relief.  States he has done a lot of physical labor involving him needing to crawl around on his knees a lot and believes this is what has caused his pain. No falls or twisting injuries.      Physical Exam   Triage Vital Signs: ED Triage Vitals  Encounter Vitals Group     BP 11/21/22 1645 128/79     Systolic BP Percentile --      Diastolic BP Percentile --      Pulse Rate 11/21/22 1645 62     Resp 11/21/22 1645 20     Temp 11/21/22 1645 98 F (36.7 C)     Temp Source 11/21/22 1645 Oral     SpO2 11/21/22 1645 100 %     Weight --      Height --      Head Circumference --      Peak Flow --      Pain Score 11/21/22 1653 8     Pain Loc --      Pain Education --      Exclude from Growth Chart --     Most recent vital signs: Vitals:   11/21/22 1645  BP: 128/79  Pulse: 62  Resp: 20  Temp: 98 F (36.7 C)  SpO2: 100%    General: Awake, no distress.  CV:  Good peripheral perfusion.  Resp:  Normal effort.  Abd:  No distention. Other:  TTP over the pes anserine, no swelling or skin changes when compared with the left knee. Patient can flex his knee to about 90 degrees but then has severe pain with further flexion, no ligament laxity with varus and valgus stress, negative anterior and posterior drawer, negative lachmans, negative patellar grind   ED Results / Procedures / Treatments   Labs (all labs ordered are listed, but only abnormal results are displayed) Labs Reviewed - No data to display   PROCEDURES:  Critical Care performed: No  Procedures   MEDICATIONS  ORDERED IN ED: Medications  ketorolac (TORADOL) 15 MG/ML injection 15 mg (has no administration in time range)  dexamethasone (DECADRON) injection 10 mg (has no administration in time range)     IMPRESSION / MDM / ASSESSMENT AND PLAN / ED COURSE  I reviewed the triage vital signs and the nursing notes.                             57 year old male presents for evaluation of right knee pain. VSS and patient NAD on exam.   Differential diagnosis includes, but is not limited to, arthritis, ligament injury, meniscus injury, fracture, muscle strain, bursitis.   Patient's presentation is most consistent with acute, uncomplicated illness.  I do not feel that xrays are indicated at this time as patient has not had any trauma to the area. He is tender over the Pes Anserine bursa. I believe his pain is due to an arthritis flare or bursitis. I will treat with an anti-inflammatory.  I also advised rest, ice and elevation.  Patient was agreeable to plan, voiced understanding and was stable at discharge.      FINAL CLINICAL IMPRESSION(S) / ED DIAGNOSES   Final diagnoses:  Pes anserine bursitis     Rx / DC Orders   ED Discharge Orders          Ordered    meloxicam (MOBIC) 15 MG tablet  Daily        11/21/22 1924    lidocaine (HM LIDOCAINE PATCH) 4 %  Every 24 hours        11/21/22 1929             Note:  This document was prepared using Dragon voice recognition software and may include unintentional dictation errors.   Cameron Ali, PA-C 11/21/22 1930    Minna Antis, MD 11/21/22 [phone]

## 2022-11-21 NOTE — ED Triage Notes (Signed)
Reports knee pain since Thursday evening. Treated it as gout w no relief.

## 2022-11-21 NOTE — Discharge Instructions (Addendum)
Please take the meloxicam once a day for 14 days, start taking it tomorrow.  This is an anti-inflammatory medication.  While taking this med do not take any other NSAIDs like ibuprofen, naproxen, Advil or Aleve.  Do not take the colchicine while taking this medication.  You can take 650 mg of Tylenol as needed for pain.  I would also encourage you to wear knee pads if you are going to be working on your knees. This will lessen the pressure on the joint. Try to take it easy for the next few days.  I have attached information about pes anserine bursitis and some exercises to help with rehab.  You can use ice heat and elevate your knee as needed.  You can also use topical pain relievers like muscle cramps and lidocaine patches.  You may follow-up with orthopedics if you do not have an improvement in your pain.  It was a pleasure to care for you today!

## 2023-01-12 DIAGNOSIS — Z23 Encounter for immunization: Secondary | ICD-10-CM | POA: Diagnosis not present

## 2023-01-12 DIAGNOSIS — E785 Hyperlipidemia, unspecified: Secondary | ICD-10-CM | POA: Diagnosis not present

## 2023-01-12 DIAGNOSIS — G4700 Insomnia, unspecified: Secondary | ICD-10-CM | POA: Diagnosis not present

## 2023-01-12 DIAGNOSIS — M1A0720 Idiopathic chronic gout, left ankle and foot, without tophus (tophi): Secondary | ICD-10-CM | POA: Diagnosis not present

## 2023-01-12 DIAGNOSIS — K219 Gastro-esophageal reflux disease without esophagitis: Secondary | ICD-10-CM | POA: Diagnosis not present

## 2023-01-12 DIAGNOSIS — Z87891 Personal history of nicotine dependence: Secondary | ICD-10-CM | POA: Diagnosis not present

## 2023-01-12 DIAGNOSIS — Z79899 Other long term (current) drug therapy: Secondary | ICD-10-CM | POA: Diagnosis not present

## 2023-07-19 DIAGNOSIS — M1A0720 Idiopathic chronic gout, left ankle and foot, without tophus (tophi): Secondary | ICD-10-CM | POA: Diagnosis not present

## 2023-07-19 DIAGNOSIS — N182 Chronic kidney disease, stage 2 (mild): Secondary | ICD-10-CM | POA: Diagnosis not present

## 2023-07-19 DIAGNOSIS — E785 Hyperlipidemia, unspecified: Secondary | ICD-10-CM | POA: Diagnosis not present

## 2023-07-19 DIAGNOSIS — G4700 Insomnia, unspecified: Secondary | ICD-10-CM | POA: Diagnosis not present

## 2023-10-14 ENCOUNTER — Other Ambulatory Visit: Payer: Self-pay

## 2023-10-14 ENCOUNTER — Emergency Department (HOSPITAL_COMMUNITY)

## 2023-10-14 ENCOUNTER — Encounter (HOSPITAL_COMMUNITY): Payer: Self-pay

## 2023-10-14 ENCOUNTER — Emergency Department (HOSPITAL_COMMUNITY)
Admission: EM | Admit: 2023-10-14 | Discharge: 2023-10-14 | Disposition: A | Discharge status: Home / Self Care | Attending: Emergency Medicine | Admitting: Emergency Medicine

## 2023-10-14 DIAGNOSIS — X509XXA Other and unspecified overexertion or strenuous movements or postures, initial encounter: Secondary | ICD-10-CM | POA: Insufficient documentation

## 2023-10-14 DIAGNOSIS — M25552 Pain in left hip: Secondary | ICD-10-CM | POA: Insufficient documentation

## 2023-10-14 LAB — URINALYSIS, ROUTINE W REFLEX MICROSCOPIC
Bilirubin Urine: NEGATIVE
Glucose, UA: NEGATIVE mg/dL
Hgb urine dipstick: NEGATIVE
Ketones, ur: NEGATIVE mg/dL
Leukocytes,Ua: NEGATIVE
Nitrite: NEGATIVE
Protein, ur: NEGATIVE mg/dL
Specific Gravity, Urine: 1.014 (ref 1.005–1.030)
pH: 5 (ref 5.0–8.0)

## 2023-10-14 MED ORDER — MELOXICAM 7.5 MG PO TABS: 7.5000 mg | ORAL_TABLET | Freq: Every day | ORAL | 0 refills | Status: AC

## 2023-10-14 MED ORDER — KETOROLAC TROMETHAMINE 15 MG/ML IJ SOLN
15.0000 mg | Freq: Once | INTRAMUSCULAR | Status: AC
  Administered 2023-10-14: 15 mg via INTRAMUSCULAR
  Filled 2023-10-14: qty 1

## 2023-10-14 MED ORDER — LIDOCAINE 4 % EX PTCH: 1.0000 | MEDICATED_PATCH | CUTANEOUS | 1 refills | Status: AC

## 2023-10-14 NOTE — ED Notes (Signed)
 Pt/family received d/c paperwork at this time. After going over the paperwork any questions, comments, or concerns were answered to the best of this nurse's knowledge. The pt/family verbally acknowledged the teachings/instructions.

## 2023-10-14 NOTE — ED Triage Notes (Signed)
 Pt to er, pt states that the other day he bent over and had a sudden onset of L hip pain, pt ambulatory.  Pt denies urinary symptoms

## 2023-10-14 NOTE — ED Provider Notes (Signed)
 [place] EMERGENCY DEPARTMENT AT [facility] Provider Note   CSN: [id number] Arrival date & time: 10/14/23  1010     Patient presents with: Hip Pain   Timothy Boone is a 58 y.o. male.  Chronic back pain, degenerative disc disease.  Does ER with salons of left hip pain that started 3 days ago when he bent over to pick up a screw he had sharp pain and has had continued pain since then, he has pain in the left buttock, left anterior and lateral hip.  He states occasionally it radiates to the mid anterior thigh.  Denies saddle anesthesia or paresthesia, no bowel or bladder incontinence.  He does report he had some urinary frequency yesterday but denies dysuria, denies flank pain, denies fever or chills.  No unintentional weight loss.  He does smoke cigarettes denies drug use including IVDA he is not diabetic.    Hip Pain       Prior to Admission medications   Medication Sig Start Date End Date Taking? Authorizing Provider  meloxicam  (MOBIC ) 7.5 MG tablet Take 1 tablet (7.5 mg total) by mouth daily. 10/14/23  Yes Shahira Fiske A, PA-C  allopurinol (ZYLOPRIM) 100 MG tablet Take 100 mg by mouth daily. 10/22/18   [provider]  atorvastatin (LIPITOR) 20 MG tablet Take 20 mg by mouth daily. 10/21/18   [provider]  fluticasone  (FLONASE ) 50 MCG/ACT nasal spray Place 1 spray into both nostrils daily. 06/14/15   Dowless, Samantha Tripp, PA-C  gabapentin  (NEURONTIN ) 100 MG capsule Take 100 mg by mouth 3 (three) times daily. 09/06/18   [provider]  lidocaine  (HM LIDOCAINE  PATCH) 4 % Place 1 patch onto the skin daily. 10/14/23   Suellen Cantor A, PA-C  omeprazole (PRILOSEC) 20 MG capsule Take 20 mg by mouth daily. 08/20/18   [provider]    Allergies: Chicken allergy, Demerol [meperidine], Penicillins, and Tramadol     Review of Systems  Updated Vital Signs BP 132/84 (BP Location: Right Arm)   Pulse 61   Temp 98.6 F (37 C) (Oral)    Resp 17   Ht 6' (1.829 m)   Wt 81.6 kg   SpO2 100%   BMI 24.41 kg/m   Physical Exam Vitals and nursing note reviewed.  Constitutional:      General: He is not in acute distress.    Appearance: He is well-developed.  HENT:     Head: Normocephalic and atraumatic.     Mouth/Throat:     Mouth: Mucous membranes are moist.  Eyes:     Extraocular Movements: Extraocular movements intact.     Conjunctiva/sclera: Conjunctivae normal.  Cardiovascular:     Rate and Rhythm: Normal rate and regular rhythm.     Heart sounds: No murmur heard. Pulmonary:     Effort: Pulmonary effort is normal. No respiratory distress.     Breath sounds: Normal breath sounds.  Abdominal:     Palpations: Abdomen is soft.     Tenderness: There is no abdominal tenderness. There is no guarding or rebound.     Hernia: No hernia is present.  Musculoskeletal:        General: No swelling.     Cervical back: Neck supple.       Legs:     Comments: Negative left straight leg raise  Skin:    General: Skin is warm and dry.     Capillary Refill: Capillary refill takes less than 2 seconds.  Neurological:  General: No focal deficit present.     Mental Status: He is alert and oriented to person, place, and time.     Sensory: No sensory deficit.     Motor: No weakness.     Gait: Gait normal.     Comments: Strength bilateral lower extremities 5 out of 5.  Psychiatric:        Mood and Affect: Mood normal.     (all labs ordered are listed, but only abnormal results are displayed) Labs Reviewed  URINALYSIS, ROUTINE W REFLEX MICROSCOPIC    EKG: None  Radiology: DG Hip Unilat W or Wo Pelvis 2-3 Views Left Result Date: 10/14/2023 EXAM: 2 OR MORE VIEW(S) XRAY OF THE LEFT HIP 10/14/2023 11:03:00 AM COMPARISON: 04/25/2013 CLINICAL HISTORY: left hip pain. Per chart: Pt to er, pt states that the other day he bent over and had a sudden onset of L hip pain, pt ambulatory. Pt denies urinary symptoms FINDINGS: BONES  AND JOINTS: No acute fracture or focal osseous lesion. Mild acetabular spurring left greater than right with slight joint space narrowing. SOFT TISSUES: The soft tissues are unremarkable. IMPRESSION: 1. No acute osseous abnormality. Electronically signed by: Waddell Calk MD 10/14/2023 11:07 AM EDT RP Workstation: [id number]     Procedures   Medications Ordered in the ED  ketorolac  (TORADOL ) 15 MG/ML injection 15 mg (15 mg Intramuscular Given 10/14/23 1204)                                    Medical Decision Making Differential diagnose includes but not limited to fracture, sprain, strain, contusion, dislocation, arthritis, bursitis, radiculopathy, other.  ED course: Patient here with left hip pain that started suddenly after bending over to pick up a screw 2 days ago.  He has pain that is sharp in the left hip diffusely has normal range of motion of the hip, no knee pain or ankle pain.  He does have some left buttock tenderness but no low back pain, no lumbar tenderness on exam.  He took 2 oxycodone  that he had leftover from a prior injury at home without relief.  Consider possible radiculopathy as he does occasionally have some pain radiating down his leg but he has negative straight leg raise and no pain in his low back.  He has back pain red flags (TUNAFISH negative).  Pain is likely musculoskeletal in nature.  He has no systemic symptoms and no joint swelling redness or restriction of range of motion so I do not feel he has septic arthritis.  X-ray of left hip this viewed and interpreted by me, shows some degenerative changes of the left hip, no fracture or dislocation.  I agree with radiology read.  Will give patient IM Toradol .  He requesting urinalysis as he had some urinary frequency yesterday so we will also assess that.  UA is normal, patient had improvement of pain with IM Toradol , sent in with lidocaine  patches, meloxicam  and follow-up with PCP and orthopedics.  He is requesting  new PCP referral as he just moved here from [place] .  He was given strict return precautions.   Amount and/or Complexity of Data Reviewed Labs: ordered. Radiology: ordered.  Risk OTC drugs. Prescription drug management.        Final diagnoses:  Left hip pain    ED Discharge Orders          Ordered    lidocaine  (HM  LIDOCAINE  PATCH) 4 %  Every 24 hours        10/14/23 1312    meloxicam  (MOBIC ) 7.5 MG tablet  Daily        10/14/23 [address] PA-C 10/14/23 1333    Charlyn Sora, MD 10/17/23 2151

## 2023-10-14 NOTE — Discharge Instructions (Signed)
 Pleasure take care of you today.  You were seen for left hip pain that has been ongoing after you felt a pop when bending over several days ago.  Your x-ray showed some arthritis of your hips left greater than right.  We are prescribing medication for pain (Mobic ) is a nonsteroidal anti-inflammatory.  I also have prescribed him patches to help with the pain.  He can also take over-the-counter Tylenol  as directed on the packaging as needed for pain.  Please follow-up with your PCP.  If you are not having improvement we can also follow-up with orthopedics.  [facility] Primary Care Doctor List    Rollene Pesa, MD. Specialty: [facility] Medicine Contact information: [address]  [place] [address]  [phone]   Glendia Fielding, MD. Specialty: [facility] Medicine Contact information: [address]  [place] [address]  [phone]   Benita Outhouse, MD Specialty: Internal Medicine Contact information: [address] [place] [address]  [phone]   Darlyn Hurst, MD. Specialty: Internal Medicine Contact information: [address]  [place] [address]  [phone]    [facility] ([facility]) Specialty: Family Medicine Contact information: [address]  [place] [address]  [phone]   Garnette Lolling, MD. Specialty: [facility] Medicine Contact information: [address]  [address]  [place] [address]  [phone]   Gaither Langton, MD. Specialty: Internal Medicine Contact information: [address]  [address]  [place] [address]  [phone]    [facility]  [address] [place], [place] [address] [phone]  Services [facility] offers a variety of basic health services.  Services include but are not limited to: Blood pressure checks  Heart rate checks  Blood sugar checks  Urine analysis  Rapid strep tests  Pregnancy tests.  Health  education and referrals  People needing more complex services will be directed to a physician online. Using these virtual visits, doctors can evaluate and prescribe medicine and treatments. There will be no medication on-site, though [facility] will help patients fill their prescriptions at little to no cost.   For More information please go to: [email or web address]

## 2024-01-11 ENCOUNTER — Emergency Department (HOSPITAL_COMMUNITY): Admission: EM | Admit: 2024-01-11 | Discharge: 2024-01-12 | Disposition: A | Discharge status: Home / Self Care

## 2024-01-11 ENCOUNTER — Other Ambulatory Visit: Payer: Self-pay

## 2024-01-11 ENCOUNTER — Encounter (HOSPITAL_COMMUNITY): Payer: Self-pay

## 2024-01-11 DIAGNOSIS — T1592XA Foreign body on external eye, part unspecified, left eye, initial encounter: Secondary | ICD-10-CM

## 2024-01-11 DIAGNOSIS — X58XXXA Exposure to other specified factors, initial encounter: Secondary | ICD-10-CM | POA: Diagnosis not present

## 2024-01-11 DIAGNOSIS — Y9389 Activity, other specified: Secondary | ICD-10-CM | POA: Insufficient documentation

## 2024-01-11 DIAGNOSIS — S0502XA Injury of conjunctiva and corneal abrasion without foreign body, left eye, initial encounter: Secondary | ICD-10-CM

## 2024-01-11 DIAGNOSIS — T1502XA Foreign body in cornea, left eye, initial encounter: Secondary | ICD-10-CM | POA: Insufficient documentation

## 2024-01-11 MED ORDER — FLUORESCEIN SODIUM 1 MG OP STRP
1.0000 | ORAL_STRIP | Freq: Once | OPHTHALMIC | Status: AC
  Administered 2024-01-12: 1 via OPHTHALMIC
  Filled 2024-01-11: qty 1

## 2024-01-11 MED ORDER — TETRACAINE HCL 0.5 % OP SOLN
2.0000 [drp] | Freq: Once | OPHTHALMIC | Status: AC
  Administered 2024-01-12: 2 [drp] via OPHTHALMIC
  Filled 2024-01-11: qty 4

## 2024-01-11 NOTE — ED Triage Notes (Signed)
 Pt reports pain in L eye since 4pm today. Pt unsure if he got something in eye.

## 2024-01-12 MED ORDER — ERYTHROMYCIN 5 MG/GM OP OINT
TOPICAL_OINTMENT | Freq: Once | OPHTHALMIC | Status: AC
  Administered 2024-01-12: 1 via OPHTHALMIC
  Filled 2024-01-12: qty 3.5

## 2024-01-12 NOTE — Discharge Instructions (Addendum)
 Call the ophthalmologist listed below and try to get an appointment for first thing in the morning. Tell them you had a piece of metal in your eye and we were unable to get it out in the emergency department.  If these offices are open or can get you and you can call other offices around the area but is important that you see someone in the morning  Use your erythromycin  ointment 4 times a day.

## 2024-01-12 NOTE — ED Provider Notes (Signed)
 " [place] EMERGENCY DEPARTMENT AT [facility] Provider Note   CSN: [id number] Arrival date & time: 01/11/24  2216     Patient presents with: Eye Pain   KENYON EICHELBERGER is a 59 y.o. male.   20-year-old male presents for evaluation of eye pain.  States he was grinding something today and felt like he got a piece of metal in his eye.  States it started to hurt worse when he came to the ER.SABRA  He states this has happened before.  He denies any vision changes or any other symptoms or concerns at this time.   Eye Pain Pertinent negatives include no chest pain, no abdominal pain and no shortness of breath.       Prior to Admission medications  Medication Sig Start Date End Date Taking? Authorizing Provider  allopurinol (ZYLOPRIM) 100 MG tablet Take 100 mg by mouth daily. 10/22/18   [provider]  atorvastatin (LIPITOR) 20 MG tablet Take 20 mg by mouth daily. 10/21/18   [provider]  fluticasone  (FLONASE ) 50 MCG/ACT nasal spray Place 1 spray into both nostrils daily. 06/14/15   Dowless, Samantha Tripp, PA-C  gabapentin  (NEURONTIN ) 100 MG capsule Take 100 mg by mouth 3 (three) times daily. 09/06/18   [provider]  lidocaine  (HM LIDOCAINE  PATCH) 4 % Place 1 patch onto the skin daily. 10/14/23   Beatty, Celeste A, PA-C  meloxicam  (MOBIC ) 7.5 MG tablet Take 1 tablet (7.5 mg total) by mouth daily. 10/14/23   Suellen Cantor A, PA-C  omeprazole (PRILOSEC) 20 MG capsule Take 20 mg by mouth daily. 08/20/18   [provider]    Allergies: Chicken allergy, Demerol [meperidine], Penicillins, and Tramadol     Review of Systems  Constitutional:  Negative for chills and fever.  HENT:  Negative for ear pain and sore throat.   Eyes:  Positive for pain. Negative for visual disturbance.  Respiratory:  Negative for cough and shortness of breath.   Cardiovascular:  Negative for chest pain and palpitations.  Gastrointestinal:  Negative for abdominal pain  and vomiting.  Genitourinary:  Negative for dysuria and hematuria.  Musculoskeletal:  Negative for arthralgias and back pain.  Skin:  Negative for color change and rash.  Neurological:  Negative for seizures and syncope.  All other systems reviewed and are negative.   Updated Vital Signs BP (!) 148/91   Pulse 64   Temp 98.2 F (36.8 C) (Oral)   Resp 16   Ht 6' (1.829 m)   Wt 81.6 kg   SpO2 99%   BMI 24.41 kg/m   Physical Exam Vitals and nursing note reviewed.  Constitutional:      General: He is not in acute distress.    Appearance: He is well-developed.  HENT:     Head: Normocephalic and atraumatic.  Eyes:     Conjunctiva/sclera: Conjunctivae normal.     Comments: Left eye is injected, there is evidence of small metal flake that seems to be embedded in the cornea on the medial side of the left eye over the iris at about 9:00, there is evidence of corneal abrasion but negative Seidel sign on Woods lamp exam  Cardiovascular:     Rate and Rhythm: Normal rate and regular rhythm.     Heart sounds: No murmur heard. Pulmonary:     Effort: Pulmonary effort is normal. No respiratory distress.     Breath sounds: Normal breath sounds.  Abdominal:     Palpations: Abdomen is soft.  Tenderness: There is no abdominal tenderness.  Musculoskeletal:        General: No swelling.     Cervical back: Neck supple.  Skin:    General: Skin is warm and dry.     Capillary Refill: Capillary refill takes less than 2 seconds.  Neurological:     General: No focal deficit present.     Mental Status: He is alert.  Psychiatric:        Mood and Affect: Mood normal.     (all labs ordered are listed, but only abnormal results are displayed) Labs Reviewed - No data to display  EKG: None  Radiology: No results found.   Procedures   Medications Ordered in the ED  tetracaine  (PONTOCAINE) 0.5 % ophthalmic solution 2 drop (2 drops Both Eyes Given 01/12/24 0316)  fluorescein  ophthalmic strip  1 strip (1 strip Both Eyes Given 01/12/24 0316)  erythromycin  ophthalmic ointment (1 Application Left Eye Given 01/12/24 0316)                                    Medical Decision Making Patient with corneal abrasion and metal flake in his eye.  I did try to attempt to remove this with a 25-gauge needle but unable to do so.  Patient with evidence of corneal abrasion but negative Sidel sign.  Was given tetracaine  drops here will give him erythromycin  ointment and advised to obtain incredibly close follow-up with ophthalmology.  Given a couple different offices to call in the morning to let them know what is going on.  Advised to come to the ER if he is unable to obtain follow-up with them or to return for any new or worsening symptoms.  He feels comfortable being discharged home.  Advised Tylenol  Motrin  as needed for pain.  Problems Addressed: Abrasion of left cornea, initial encounter: acute illness or injury Foreign body, eye, left, initial encounter: acute illness or injury  Amount and/or Complexity of Data Reviewed External Data Reviewed: notes.    Details: Prior ED records reviewed and patient seen 11-21-2022 for bursitis  Risk OTC drugs. Prescription drug management.    Final diagnoses:  Foreign body, eye, left, initial encounter  Abrasion of left cornea, initial encounter    ED Discharge Orders     None          Gennaro Duwaine CROME, DO 01/12/24 0617  "
